# Patient Record
Sex: Female | Born: 1955
Health system: Southern US, Community
[De-identification: ages and names within clinical notes are randomized; demographics above are authoritative.]

## PROBLEM LIST (undated history)

## (undated) DIAGNOSIS — I10 Essential (primary) hypertension: Secondary | ICD-10-CM

## (undated) DIAGNOSIS — M8580 Other specified disorders of bone density and structure, unspecified site: Secondary | ICD-10-CM

## (undated) DIAGNOSIS — T7840XA Allergy, unspecified, initial encounter: Secondary | ICD-10-CM

## (undated) DIAGNOSIS — E785 Hyperlipidemia, unspecified: Secondary | ICD-10-CM

## (undated) DIAGNOSIS — N8189 Other female genital prolapse: Secondary | ICD-10-CM

## (undated) DIAGNOSIS — B379 Candidiasis, unspecified: Secondary | ICD-10-CM

## (undated) DIAGNOSIS — D219 Benign neoplasm of connective and other soft tissue, unspecified: Secondary | ICD-10-CM

## (undated) DIAGNOSIS — M1990 Unspecified osteoarthritis, unspecified site: Secondary | ICD-10-CM

## (undated) DIAGNOSIS — N926 Irregular menstruation, unspecified: Secondary | ICD-10-CM

## (undated) DIAGNOSIS — D649 Anemia, unspecified: Secondary | ICD-10-CM

## (undated) DIAGNOSIS — Z8742 Personal history of other diseases of the female genital tract: Secondary | ICD-10-CM

## (undated) DIAGNOSIS — Z8639 Personal history of other endocrine, nutritional and metabolic disease: Secondary | ICD-10-CM

## (undated) DIAGNOSIS — Z8619 Personal history of other infectious and parasitic diseases: Secondary | ICD-10-CM

## (undated) DIAGNOSIS — L900 Lichen sclerosus et atrophicus: Secondary | ICD-10-CM

## (undated) HISTORY — DX: Unspecified osteoarthritis, unspecified site: M19.90

## (undated) HISTORY — DX: Personal history of other infectious and parasitic diseases: Z86.19

## (undated) HISTORY — PX: TONSILLECTOMY: SHX5217

## (undated) HISTORY — DX: Personal history of other diseases of the female genital tract: Z87.42

## (undated) HISTORY — DX: Benign neoplasm of connective and other soft tissue, unspecified: D21.9

## (undated) HISTORY — DX: Personal history of other endocrine, nutritional and metabolic disease: Z86.39

## (undated) HISTORY — DX: Other female genital prolapse: N81.89

## (undated) HISTORY — DX: Irregular menstruation, unspecified: N92.6

## (undated) HISTORY — DX: Essential (primary) hypertension: I10

## (undated) HISTORY — PX: ABDOMINAL HYSTERECTOMY: SHX81

## (undated) HISTORY — DX: Candidiasis, unspecified: B37.9

## (undated) HISTORY — PX: COLONOSCOPY: SHX174

## (undated) HISTORY — DX: Hyperlipidemia, unspecified: E78.5

## (undated) HISTORY — DX: Allergy, unspecified, initial encounter: T78.40XA

## (undated) HISTORY — DX: Other specified disorders of bone density and structure, unspecified site: M85.80

## (undated) HISTORY — DX: Lichen sclerosus et atrophicus: L90.0

## (undated) HISTORY — DX: Anemia, unspecified: D64.9

---

## 1992-12-09 HISTORY — PX: DILATION AND EVACUATION: SHX1459

## 2003-03-31 ENCOUNTER — Other Ambulatory Visit: Admission: RE | Admit: 2003-03-31 | Discharge: 2003-03-31 | Payer: Self-pay | Admitting: Obstetrics and Gynecology

## 2003-06-03 ENCOUNTER — Encounter: Payer: Self-pay | Admitting: Emergency Medicine

## 2003-06-03 ENCOUNTER — Emergency Department (HOSPITAL_COMMUNITY): Admission: EM | Admit: 2003-06-03 | Discharge: 2003-06-03 | Payer: Self-pay | Admitting: Emergency Medicine

## 2004-05-09 ENCOUNTER — Other Ambulatory Visit: Admission: RE | Admit: 2004-05-09 | Discharge: 2004-05-09 | Payer: Self-pay | Admitting: Obstetrics and Gynecology

## 2005-06-26 ENCOUNTER — Other Ambulatory Visit: Admission: RE | Admit: 2005-06-26 | Discharge: 2005-06-26 | Payer: Self-pay | Admitting: Obstetrics and Gynecology

## 2008-02-07 HISTORY — PX: VULVA /PERINEUM BIOPSY: SHX319

## 2008-04-22 ENCOUNTER — Encounter: Payer: Self-pay | Admitting: Internal Medicine

## 2008-05-24 ENCOUNTER — Ambulatory Visit: Payer: Self-pay | Admitting: Internal Medicine

## 2009-06-06 ENCOUNTER — Emergency Department (HOSPITAL_COMMUNITY): Admission: EM | Admit: 2009-06-06 | Discharge: 2009-06-06 | Payer: Self-pay | Admitting: Family Medicine

## 2009-12-05 ENCOUNTER — Ambulatory Visit: Payer: Self-pay | Admitting: Orthopedic Surgery

## 2009-12-05 DIAGNOSIS — M654 Radial styloid tenosynovitis [de Quervain]: Secondary | ICD-10-CM | POA: Insufficient documentation

## 2010-12-20 ENCOUNTER — Encounter: Payer: Self-pay | Admitting: Internal Medicine

## 2011-01-10 NOTE — Miscellaneous (Signed)
Summary: Epipen Rx  Per Dr Regino Schultze verbal orders, she would like patient to have epipen sent to her pharmacy. Dottie Nelson-Smith CMA Duncan Dull)  December 20, 2010 12:25 PM    Clinical Lists Changes  Medications: Added new medication of EPIPEN 2-PAK 0.3 MG/0.3ML DEVI (EPINEPHRINE) Use as Directed - Signed Rx of EPIPEN 2-PAK 0.3 MG/0.3ML DEVI (EPINEPHRINE) Use as Directed;  #1 pak x 0;  Signed;  Entered by: Lamona Curl CMA (AAMA);  Authorized by: Hart Carwin MD;  Method used: Electronically to [facility]*, [address] Shipping/mailing, [place], [place]  [address], Ph: [phone], Fax: [phone]    Prescriptions: EPIPEN 2-PAK 0.3 MG/0.3ML DEVI (EPINEPHRINE) Use as Directed  #1 pak x 0   Entered by:   Lamona Curl CMA (AAMA)   Authorized by:   Hart Carwin MD   Signed by:   Lamona Curl CMA (AAMA) on 12/20/2010   Method used:   Electronically to        [facility]* (retail)       [address]       [address] Shipping/mailing       [place], [place]  [address]       Ph: [phone]       Fax: [phone]   RxID:   [id number]

## 2011-04-18 ENCOUNTER — Ambulatory Visit (HOSPITAL_COMMUNITY)
Admission: RE | Admit: 2011-04-18 | Discharge: 2011-04-18 | Disposition: A | Discharge status: Home / Self Care | Payer: 59 | Source: Ambulatory Visit | Attending: Obstetrics and Gynecology | Admitting: Obstetrics and Gynecology

## 2011-04-18 DIAGNOSIS — M79609 Pain in unspecified limb: Secondary | ICD-10-CM

## 2011-11-26 ENCOUNTER — Other Ambulatory Visit (HOSPITAL_COMMUNITY): Payer: Self-pay | Admitting: Internal Medicine

## 2011-11-26 ENCOUNTER — Ambulatory Visit (HOSPITAL_COMMUNITY)
Admission: RE | Admit: 2011-11-26 | Discharge: 2011-11-26 | Disposition: A | Discharge status: Home / Self Care | Payer: 59 | Source: Ambulatory Visit | Attending: Internal Medicine | Admitting: Internal Medicine

## 2011-11-26 DIAGNOSIS — M79609 Pain in unspecified limb: Secondary | ICD-10-CM | POA: Insufficient documentation

## 2011-11-26 DIAGNOSIS — M79606 Pain in leg, unspecified: Secondary | ICD-10-CM

## 2011-11-26 DIAGNOSIS — R937 Abnormal findings on diagnostic imaging of other parts of musculoskeletal system: Secondary | ICD-10-CM | POA: Insufficient documentation

## 2011-11-29 ENCOUNTER — Other Ambulatory Visit (HOSPITAL_COMMUNITY): Payer: Self-pay | Admitting: Orthopaedic Surgery

## 2011-11-29 DIAGNOSIS — M5410 Radiculopathy, site unspecified: Secondary | ICD-10-CM

## 2011-11-29 DIAGNOSIS — M545 Low back pain, unspecified: Secondary | ICD-10-CM

## 2011-12-05 ENCOUNTER — Ambulatory Visit (HOSPITAL_COMMUNITY)
Admission: RE | Admit: 2011-12-05 | Discharge: 2011-12-05 | Disposition: A | Discharge status: Home / Self Care | Payer: 59 | Source: Ambulatory Visit | Attending: Orthopaedic Surgery | Admitting: Orthopaedic Surgery

## 2011-12-05 DIAGNOSIS — M5410 Radiculopathy, site unspecified: Secondary | ICD-10-CM

## 2011-12-05 DIAGNOSIS — M545 Low back pain, unspecified: Secondary | ICD-10-CM | POA: Insufficient documentation

## 2011-12-05 DIAGNOSIS — M5126 Other intervertebral disc displacement, lumbar region: Secondary | ICD-10-CM | POA: Insufficient documentation

## 2011-12-05 DIAGNOSIS — M79609 Pain in unspecified limb: Secondary | ICD-10-CM | POA: Insufficient documentation

## 2011-12-05 DIAGNOSIS — IMO0002 Reserved for concepts with insufficient information to code with codable children: Secondary | ICD-10-CM | POA: Insufficient documentation

## 2011-12-30 ENCOUNTER — Ambulatory Visit (HOSPITAL_COMMUNITY)
Admission: RE | Admit: 2011-12-30 | Discharge: 2011-12-30 | Disposition: A | Discharge status: Home / Self Care | Payer: 59 | Source: Ambulatory Visit | Attending: Orthopaedic Surgery | Admitting: Orthopaedic Surgery

## 2011-12-30 DIAGNOSIS — M79609 Pain in unspecified limb: Secondary | ICD-10-CM | POA: Insufficient documentation

## 2011-12-30 DIAGNOSIS — IMO0001 Reserved for inherently not codable concepts without codable children: Secondary | ICD-10-CM | POA: Insufficient documentation

## 2011-12-30 DIAGNOSIS — R262 Difficulty in walking, not elsewhere classified: Secondary | ICD-10-CM | POA: Insufficient documentation

## 2011-12-30 NOTE — Patient Instructions (Addendum)
HEP

## 2011-12-30 NOTE — Progress Notes (Signed)
Physical Therapy Evaluation  Patient Details  Name: Yesenia Larson MRN: [id number] Date of Birth: 06/29/1956  Today's Date: 12/30/2011 Time:  -     Visit#:   of    Re-eval:   Assessment Diagnosis: Radicular back pain Prior Therapy: none  Past Medical History: No past medical history on file. Past Surgical History: No past surgical history on file.  Subjective Symptoms/Limitations Symptoms: [name] states that she began having right leg pain for about a year but has not been having low back pain.  She states that her pain is worse at night.  She states that the pain is more like a toothache.  She states she is only feeling pain in the back side of her calf.  She had an MRI which showed a small disc protrusion therefore she has been referred to PT to  try and relieve her symptoms. Limitations: Standing;Walking;Other (comment) How long can you sit comfortably?: There is no difficulty sitting. How long can you stand comfortably?: The patient states that she noticed if she is standing she notices it but she has not noticed how long until it starts bothering her but she is having pain evey day at work. How long can you walk comfortably?: Walking does not seem to bother her. Special Tests: The patient states that she occasionally wakes up due to pain approximatley two times a week. Pain Assessment Currently in Pain?: Yes Pain Score:   2 (worst in the past week has been an 8, least 0) Pain Location: Calf Pain Orientation: Right Pain Type: Chronic pain Pain Relieving Factors: elevating.  Precautions/Restrictions     Prior Functioning  Home Living Lives With: Spouse Prior Function Vocation: Full time employment Vocation Requirements: standing, twisting, pushing Leisure: Hobbies-yes (Comment) Comments: garden  Cognition/Observation Cognition Overall Cognitive Status: Appears within functional limits for tasks assessed  Sensation/Coordination/Flexibility/Functional Tests     Assessment RLE Strength Right Hip Flexion: 4/5 Right Hip Extension: 5/5 Right Hip ABduction: 5/5 Right Hip ADduction: 5/5 Right Knee Flexion: 5/5 Right Knee Extension: 5/5 Right Ankle Dorsiflexion: 4/5 Lumbar AROM Lumbar Flexion: decreased 15% with increase pain going down Lumbar Extension:  (wnl with reps causing increase pain.) Lumbar - Right Side Bend: wnl reps no change Lumbar - Left Side Bend: wnl reps increase pain Lumbar - Right Rotation: wnl Lumbar - Left Rotation: wnl  Exercise/Treatments    Stretches Active Hamstring Stretch: 1 rep;30 seconds Single Knee to Chest Stretch: 3 reps;30 seconds Standing Extension: 5 reps Lumbar Exercises   Stability Bridge: 5 reps Ab Set: 10 reps      Physical Therapy Assessment and Plan     Goals PT Short Term Goals Time to Complete Short Term Goals: 2 weeks PT Short Term Goal 1: Pain no greater than a 5  PT Short Term Goal 2: Pt to be able to verbalize and demonstrate proper lifting, squating. PT Long Term Goals Time to Complete Long Term Goals: 4 weeks PT Long Term Goal 1: I in Advance HEP PT Long Term Goal 2: Pain to be no greater than a 2 Long Term Goal 3: Radicular pain to only occur one to two times a week.  Problem List Patient Active Problem List  Diagnoses  . DEQUERVAIN'S      Larson,Yesenia 12/30/2011, 12:24 PM  Physician Documentation Your signature is required to indicate approval of the treatment plan as stated above.  Please sign and either send electronically or make a copy of this report for your files and return this  physician signed original.   Please mark one 1.__approve of plan  2. ___approve of plan with the following conditions.   ______________________________                                                          _____________________ Physician Signature                                                                                                             Date

## 2012-01-08 ENCOUNTER — Telehealth (HOSPITAL_COMMUNITY): Payer: Self-pay | Admitting: Physical Therapy

## 2012-01-08 ENCOUNTER — Ambulatory Visit (HOSPITAL_COMMUNITY)
Admission: RE | Admit: 2012-01-08 | Discharge: 2012-01-08 | Disposition: A | Discharge status: Home / Self Care | Payer: 59 | Source: Ambulatory Visit | Attending: Orthopaedic Surgery | Admitting: Orthopaedic Surgery

## 2012-01-08 ENCOUNTER — Inpatient Hospital Stay (HOSPITAL_COMMUNITY)
Admission: RE | Admit: 2012-01-08 | Discharge: 2012-01-08 | Payer: 59 | Source: Ambulatory Visit | Attending: Physical Therapy | Admitting: Physical Therapy

## 2012-01-08 NOTE — Progress Notes (Signed)
Physical Therapy Treatment Patient Details  Name: MAHROSH DONNELL MRN: [id number] Date of Birth: [birth year]  Today's Date: 01/08/2012 Time: 1528-1600 Time Calculation (min): 32 min Visit#: 2  of 4   Re-eval: 01/27/12 Charges:  30'    Subjective: Symptoms/Limitations Symptoms: Pt. states her back does not hurt at all, just her right calf.  Pt. states it has improved some since beginning therapy. Pain Assessment Currently in Pain?: Yes Pain Score:   3 Pain Location: Calf Pain Orientation: Right   Exercise/Treatments Stretches Active Hamstring Stretch: 3 reps;30 seconds Single Knee to Chest Stretch: 3 reps;30 seconds Prone on Elbows Stretch: Limitations Prone on Elbows Stretch Limitations: , instructed to do HEP Lumbar Exercises Scapular Retraction: 10 reps;Theraband Theraband Level (Scapular Retraction): Level 3 (Green) Row: 10 reps;Theraband Theraband Level (Row): Level 3 (Green) Shoulder Extension: 10 reps;Theraband Theraband Level (Shoulder Extension): Level 3 (Green) Stability Clam: 5 reps Bridge: 10 reps Ab Set: 10 reps Heel Squeeze: Prone;10 reps;5 seconds Single Arm Raise: Prone;5 reps Leg Raise: Prone;5 reps Machine Exercises Tread Mill: 5' @ 2.59mph     Physical Therapy Assessment and Plan PT Assessment and Plan Clinical Impression Statement: Added theraband for postural strengthening as well as prone exercises.  Pt. with good form and stab with prone exercises; requires reinforcement for tband form. PT Plan: Add wallsquats, gastroc stretch, prone opposite arm/leg and abdominal exercises with physioball next visit.  Give tband/written instructions for HEP.     Problem List Patient Active Problem List  Diagnoses  . DEQUERVAIN'S    PT - End of Session Activity Tolerance: Patient tolerated treatment well General Behavior During Session: [facility] for tasks performed Cognition: [facility] for tasks performed  Emeline Gins B 01/08/2012, 4:04 PM

## 2012-01-16 ENCOUNTER — Ambulatory Visit (HOSPITAL_COMMUNITY)
Admission: RE | Admit: 2012-01-16 | Discharge: 2012-01-16 | Disposition: A | Discharge status: Home / Self Care | Payer: 59 | Source: Ambulatory Visit | Attending: Internal Medicine | Admitting: Internal Medicine

## 2012-01-16 DIAGNOSIS — IMO0001 Reserved for inherently not codable concepts without codable children: Secondary | ICD-10-CM | POA: Insufficient documentation

## 2012-01-16 DIAGNOSIS — M79609 Pain in unspecified limb: Secondary | ICD-10-CM | POA: Insufficient documentation

## 2012-01-16 DIAGNOSIS — R262 Difficulty in walking, not elsewhere classified: Secondary | ICD-10-CM | POA: Insufficient documentation

## 2012-01-16 NOTE — Progress Notes (Signed)
Physical Therapy Treatment Patient Details  Name: Yesenia Larson MRN: [id number] Date of Birth: 03-Sep-1956  Today's Date: 01/16/2012 Time: 4098-1191 Time Calculation (min): 43 min Charges: 43 TE Visit#: 3  of 4   Re-eval: 01/27/12    Subjective: Symptoms/Limitations Symptoms: Yesenia Larson was a good day.  Her calf only hurt two times and she didn't hardly feel it when she was sleeping last night.  No back pain only calf pain.   Pain Assessment Pain Score:   2 Pain Location: Calf Pain Orientation: Right Pain Type: Chronic pain  Exercise/Treatments Stretches Active Hamstring Stretch: 30 seconds;2 reps Single Knee to Chest Stretch: 2 reps;30 seconds Standing Extension: 10 seconds Prone on Elbows Stretch: Limitations Prone on Elbows Stretch Limitations: , instructed to do HEP Lumbar Exercises Scapular Retraction: 10 reps;Theraband Theraband Level (Scapular Retraction): Level 3 (Green) Row: 10 reps;Theraband Theraband Level (Row): Level 3 (Green) Shoulder Extension: 10 reps;Theraband Theraband Level (Shoulder Extension): Level 3 (Green) Stability Clam: 5 reps Bridge: 15 reps Bent Knee Raise: 5 reps Ab Set: 10 reps Heel Squeeze: Prone;10 reps;5 seconds Single Arm Raise: Prone;5 reps Leg Raise: Prone;10 reps Opposite Arm/Leg Raise: Prone;5 reps Wall Slides: 10 reps Machine Exercises Tread Mill: 5' @ 2.1-2.4 Ankle Stretches Soleus Stretch: 2 reps;30 seconds Gastroc Stretch: 2 reps;30 seconds  Physical Therapy Assessment and Plan Clinical Impression Statement: The patient continues to progress well with ther ex.  She did not feel tighter on the left side during gastroc stretch.   PT Plan: Ask Arline Asp if she really wanted a re-assess next visit?  She will have only been 4 sessions including the eval?   Continue with PRE lumbar stability exercises.  Add functional squats, lifting education and isometrics supine with therapy ball.      Problem List Patient Active Problem  List  Diagnoses  . DEQUERVAIN'S   PT - End of Session Activity Tolerance: Patient tolerated treatment well General Behavior During Session: [facility] for tasks performed Cognition: [facility] for tasks performed PT Plan of Care PT Home Exercise Plan: added t-band scap exercises.  green band given for home.  see scanned report.   Consulted and Agree with Plan of Care: Patient  Rollene Rotunda. Sheketa Ende, PT, DPT  01/16/2012, 9:36 AM

## 2012-01-21 ENCOUNTER — Ambulatory Visit (HOSPITAL_COMMUNITY)
Admission: RE | Admit: 2012-01-21 | Discharge: 2012-01-21 | Disposition: A | Discharge status: Home / Self Care | Payer: 59 | Source: Ambulatory Visit | Attending: Internal Medicine | Admitting: Internal Medicine

## 2012-01-21 NOTE — Progress Notes (Signed)
Physical Therapy Treatment Patient Details  Name: Yesenia Larson MRN: [id number] Date of Birth: 06-07-1956  Today's Date: 01/21/2012 Time: 4098-1191 Time Calculation (min): 40 min Visit#: 4  of 8   Re-eval: 01/27/12 Charges: Therex x 38'  Subjective: Symptoms/Limitations Symptoms: Pt reports HEP compliance. Pain Assessment Currently in Pain?: Yes Pain Score:   5 Pain Location: Calf Pain Orientation: Right   Exercise/Treatments Stretches Active Hamstring Stretch: 30 seconds;2 reps Single Knee to Chest Stretch: 2 reps;30 seconds Lumbar Exercises Scapular Retraction: 15 reps Theraband Level (Scapular Retraction): Level 3 (Green) Row: 15 reps Theraband Level (Row): Level 3 (Green) Shoulder Extension: 15 reps Theraband Level (Shoulder Extension): Level 3 (Green) Stability Bridge: 15 reps Bent Knee Raise: 10 reps Large Ball Abdominal Isometric: 5 reps;5 seconds Large Ball Oblique Isometric: 5 reps;5 seconds Heel Squeeze: Prone;10 reps;5 seconds Single Arm Raise: 10 reps;Prone Leg Raise: 10 reps;Prone Opposite Arm/Leg Raise: 5 reps;Prone Functional Squats: 10 reps  Physical Therapy Assessment and Plan PT Assessment and Plan Clinical Impression Statement: Ab set d/c'd to HEP. Pt completes stabilization exercises with good form and control. Began abdominal isometrics with pball to improve core strength. Began funtcional squats to imrpove body mechanics. Pt reports no increase in pain at ned of session. PT Plan: Re-assess next session.     Problem List Patient Active Problem List  Diagnoses  . DEQUERVAIN'S    PT - End of Session Activity Tolerance: Patient tolerated treatment well General Behavior During Session: [facility] for tasks performed Cognition: [facility] for tasks performed  Antonieta Iba 01/21/2012, 9:01 AM

## 2012-02-06 ENCOUNTER — Ambulatory Visit (HOSPITAL_COMMUNITY)
Admission: RE | Admit: 2012-02-06 | Discharge: 2012-02-06 | Disposition: A | Discharge status: Home / Self Care | Payer: 59 | Source: Ambulatory Visit | Attending: Internal Medicine | Admitting: Internal Medicine

## 2012-02-06 NOTE — Evaluation (Signed)
Physical Therapy Evaluation  Patient Details  Name: Yesenia Larson MRN: [id number] Date of Birth: [birth year]  Today's Date: 02/06/2012 Time: 6578-4696 Time Calculation (min): 22 min  Visit#: 5  of 8   Re-eval: 02/06/12   Past Medical History: No past medical history on file. Past Surgical History: No past surgical history on file.  Subjective Symptoms/Limitations Symptoms: [name] states that she is 50% better.  She is no longer waking up from the pain and she is not longer taking anything for her pain. How long can you sit comfortably?: The patient has not had any difficulty sitting. How long can you stand comfortably?: The patient states she is able to do her standing activites without pain How long can you walk comfortably?: Walking has never been a problem Pain Assessment Currently in Pain?: Yes Pain Score:   1 (highest this week is a 10.) Pain Location: Calf Pain Orientation: Right Pain Type: Chronic pain   Assessment RLE Strength Right Hip Flexion: 5/5 (was 4/5) Right Hip Extension: 5/5 Right Hip ABduction: 5/5 Right Hip ADduction: 5/5 Right Knee Flexion: 5/5 Right Knee Extension: 5/5 Right Ankle Dorsiflexion: 5/5 ( was 4/5 ) Lumbar AROM Lumbar Flexion: wnl was decreased 15% Lumbar Extension: wnl with no pain was painful Lumbar - Right Side Bend: wnl Lumbar - Left Side Bend: wnl Lumbar - Right Rotation: wnl Lumbar - Left Rotation: wnl  Exercise/Treatments Reviewed Ball exercises; posture and body mechanics.     Physical Therapy Assessment and Plan PT Assessment and Plan Clinical Impression Statement: Pt doing much better feels that she is I with exercises and understands the importance of proper posture and body mechanics.  Pt ready for discharge. Rehab Potential: Good PT Plan: Discharge pt to HEP    Goals Home Exercise Program Pt will Perform Home Exercise Program: Independently PT Short Term Goals PT Short Term Goal 1: typically pain will occur one  to two times a day where before it was Larson all day thing.  When it hits her the pain is high PT Short Term Goal 1 - Progress: Progressing toward goal PT Short Term Goal 2: Pt is able to demonstrate and verbalize proper lifting. PT Long Term Goals PT Long Term Goal 1 - Progress: Met PT Long Term Goal 2 - Progress: Not met Long Term Goal 3 Progress: Progressing toward goal  Problem List Patient Active Problem List  Diagnoses  . DEQUERVAIN'S    PT - End of Session Activity Tolerance: Patient tolerated treatment well General Cognition: [facility] for tasks performed PT Plan of Care PT Home Exercise Plan: given previously   Hulet Ehrmann,CINDY 02/06/2012, 3:14 PM  Physician Documentation Your signature is required to indicate approval of the treatment plan as stated above.  Please sign and either send electronically or make a copy of this report for your files and return this physician signed original.   Please mark one 1.__approve of plan  2. ___approve of plan with the following conditions.   ______________________________                                                          _____________________ Physician Signature  Date  

## 2012-04-21 ENCOUNTER — Ambulatory Visit: Payer: Self-pay | Admitting: Obstetrics and Gynecology

## 2012-06-09 ENCOUNTER — Ambulatory Visit: Payer: Self-pay | Admitting: Obstetrics and Gynecology

## 2012-07-27 ENCOUNTER — Other Ambulatory Visit: Payer: Self-pay

## 2012-07-27 DIAGNOSIS — D649 Anemia, unspecified: Secondary | ICD-10-CM | POA: Insufficient documentation

## 2012-07-27 DIAGNOSIS — Z8742 Personal history of other diseases of the female genital tract: Secondary | ICD-10-CM | POA: Insufficient documentation

## 2012-07-27 DIAGNOSIS — E559 Vitamin D deficiency, unspecified: Secondary | ICD-10-CM | POA: Insufficient documentation

## 2012-07-27 DIAGNOSIS — L900 Lichen sclerosus et atrophicus: Secondary | ICD-10-CM | POA: Insufficient documentation

## 2012-07-27 DIAGNOSIS — N926 Irregular menstruation, unspecified: Secondary | ICD-10-CM | POA: Insufficient documentation

## 2012-07-27 DIAGNOSIS — Z8639 Personal history of other endocrine, nutritional and metabolic disease: Secondary | ICD-10-CM | POA: Insufficient documentation

## 2012-07-27 DIAGNOSIS — M8580 Other specified disorders of bone density and structure, unspecified site: Secondary | ICD-10-CM | POA: Insufficient documentation

## 2012-07-27 DIAGNOSIS — Z8619 Personal history of other infectious and parasitic diseases: Secondary | ICD-10-CM | POA: Insufficient documentation

## 2012-07-27 DIAGNOSIS — N8189 Other female genital prolapse: Secondary | ICD-10-CM | POA: Insufficient documentation

## 2012-07-27 DIAGNOSIS — D219 Benign neoplasm of connective and other soft tissue, unspecified: Secondary | ICD-10-CM | POA: Insufficient documentation

## 2012-07-30 ENCOUNTER — Encounter: Payer: Self-pay | Admitting: Obstetrics and Gynecology

## 2012-07-30 ENCOUNTER — Ambulatory Visit (INDEPENDENT_AMBULATORY_CARE_PROVIDER_SITE_OTHER): Payer: 59 | Admitting: Obstetrics and Gynecology

## 2012-07-30 ENCOUNTER — Other Ambulatory Visit: Payer: Self-pay | Admitting: Obstetrics and Gynecology

## 2012-07-30 VITALS — BP 128/72 | Ht 63.0 in | Wt 164.0 lb

## 2012-07-30 DIAGNOSIS — N841 Polyp of cervix uteri: Secondary | ICD-10-CM

## 2012-07-30 DIAGNOSIS — N8189 Other female genital prolapse: Secondary | ICD-10-CM

## 2012-07-30 NOTE — Progress Notes (Signed)
Subjective:  AEX:  Last Pap: 04/12/2010 WNL: Yes Regular Periods:no Contraception: Post-Menopause  Monthly Breast exam:yes Tetanus<77yrs:yes Nl.Bladder Function:yes Daily BMs:yes Healthy Diet:yes Calcium:no Mammogram:yes Date of Mammogram: 09/06/2011 Exercise:yes Have often Exercise: daily Seatbelt: yes Abuse at home: no Stressful work:yes Sigmoid-colonoscopy: 3 years ago Bone Density: Yes 04/10/2009 PCP: Dr. Ambrose Finland Change in PMH: None Change in [facility]: None    Yesenia Larson is a 56 y.o. female [phone] who presents for annual exam.  The patient has no complaints today. Had a single episode of spot of blood on tissue with wiping.  Pt thought it might be due to prolapse.  She denies pelvic pressure, urinary incontenence or difficulty emptying bladder  The following portions of the patient's history were reviewed and updated as appropriate: allergies, current medications, past family history, past medical history, past social history, past surgical history and problem list.  Review of Systems Pertinent items are noted in HPI. Gastrointestinal:No change in bowel habits, no abdominal pain, no rectal bleeding Genitourinary:negative for dysuria, frequency, hematuria, nocturia and urinary incontinence    Objective:     BP 128/72  Ht 5\' 3"  (1.6 m)  Wt 164 lb (74.39 kg)  BMI 29.05 kg/m2  Weight:  Wt Readings from Last 1 Encounters:  07/30/12 164 lb (74.39 kg)     BMI: Body mass index is 29.05 kg/(m^2). General Appearance: Alert, appropriate appearance for age. No acute distress HEENT: Grossly normal Neck / Thyroid: Supple, no masses, nodes or enlargement Lungs: clear to auscultation bilaterally Back: No CVA tenderness Breast Exam: No masses or nodes.No dimpling, nipple retraction or discharge. Cardiovascular: Regular rate and rhythm. S1, S2, no murmur Gastrointestinal: Soft, non-tender, no masses or organomegaly Pelvic Exam: External genitalia: normal general  appearance Vaginal: atrophic mucosa Cervix: lesions, 2 mm polyp.  No contact bleeding. Adnexa: mo masses palpated Uterus: normal single, nontender and uterine prolapse with cervix at introitus with Valsalva Rectovaginal: normal rectal, no masses Lymphatic Exam: Non-palpable nodes in neck, clavicular, axillary, or inguinal regions Skin: no rash or abnormalities Neurologic: Normal gait and speech, no tremor  Psychiatric: Alert and oriented, appropriate affect.    Urinalysis:Not done    Assessment:    Asymptomatic pelvic relaxation  Cervical polyp, ? Source of spotting?  Plan:   {Pap with HR HPV Cervical polyp removal with verbal consent F/U 1 yr for aex or prn

## 2012-07-31 LAB — PAP IG W/ RFLX HPV ASCU

## 2012-08-06 LAB — HUMAN PAPILLOMAVIRUS, HIGH RISK: HPV DNA High Risk: DETECTED — AB

## 2012-09-25 ENCOUNTER — Encounter: Payer: Self-pay | Admitting: Obstetrics and Gynecology

## 2012-09-25 ENCOUNTER — Encounter: Payer: 59 | Admitting: Obstetrics and Gynecology

## 2012-09-25 ENCOUNTER — Ambulatory Visit (INDEPENDENT_AMBULATORY_CARE_PROVIDER_SITE_OTHER): Payer: 59 | Admitting: Obstetrics and Gynecology

## 2012-09-25 VITALS — BP 130/86 | Temp 98.4°F | Wt 164.5 lb

## 2012-09-25 DIAGNOSIS — B369 Superficial mycosis, unspecified: Secondary | ICD-10-CM

## 2012-09-25 DIAGNOSIS — B977 Papillomavirus as the cause of diseases classified elsewhere: Secondary | ICD-10-CM

## 2012-09-25 NOTE — Patient Instructions (Addendum)

## 2012-09-25 NOTE — Progress Notes (Signed)
GYN PROBLEM VISIT  Ms. Yesenia Larson is a 56 y.o. year old female,[id number], who presents for a problem and for colposcopy  Subjective:  1)Colposcopy.   2)  Pt also c/o rash under right breast with itching x 2weeks s/p 3-5 days after receiving flu vaccine.   Treated with OTC monistat with good relief.  Has strong family hx diabetes.  Objective:  PAP RESULT Specimen adequacy: Comments: SATISFACTORY. Endocervical/transformation zone component present. FINAL DIAGNOSIS: Comments: - NEGATIVE FOR INTRAEPITHELIAL LESIONS OR MALIGNANCY. COMMENTS: Comments: Benign cellular changes are present.  HPV DNA High Risk DETECTED (A) Comments: One or more High/Intermediate HPV types (16,18,31,33,35,39,45,51,52,56,58,59,66,68) was detected.  Cervix- Biopsy, polyp:  Benign cervical/endocervical polyp.  Physical examination: BP 130/86  Temp 98.4 F (36.9 C)  Wt 164 lb 8 oz (74.617 kg)  Thorax:  right submammary postinflammatory hyperpigmentation  Pelvic: External genitalia: normal general appearance Vaginal: atrophic mucosa Cervix: prolapse to within 2cm of introitus  Colposcopy Procedure Note  Indications: Pap smear 2 months ago showed: benign cellular changes. Positive high risk HPV   The prior pap showed no abnormalities.     Prior cervical/vaginal disease: no history of prior disease.    Prior cervical treatment: removal of cervical polyp.  Procedure Details  The risks and benefits of the procedure and verbal and written informed consent obtained.  Speculum placed in vagina and excellent visualization of cervix achieved, cervix swabbed x 3 with acetic acid solution.  Findings: Cervix: no visible lesions, no mosaicism, no punctation and no abnormal vasculature; endocervical speculum placed, SCJ visualized 360 degrees without lesions, no biopsies taken and endocervical curettage performed. Vaginal inspection: vaginal colposcopy not performed. Vulvar colposcopy: vulvar colposcopy not  performed.  Specimens: endocervical curettage  Complications: none.   Assessment: Normal Pap smear with positive high risk HPV Fungal dermatitis Family history of diabetes   Plan: Specimens labelled and sent to Pathology. Will base further treatment on Pathology findings. Treatment options discussed with patient. Post biopsy instructions given to patient. Hemoglobin A1c Return to office for annual exam or as needed   Dierdre Forth, MD  09/25/2012 3:26 PM

## 2012-09-26 LAB — HEMOGLOBIN A1C
Hgb A1c MFr Bld: 6 % — ABNORMAL HIGH (ref <5.7)
Mean Plasma Glucose: 126 mg/dL — ABNORMAL HIGH (ref <117)

## 2012-09-29 LAB — PATHOLOGY

## 2014-10-10 ENCOUNTER — Encounter: Payer: Self-pay | Admitting: Obstetrics and Gynecology

## 2014-12-28 ENCOUNTER — Encounter: Payer: Self-pay | Admitting: Family

## 2014-12-28 ENCOUNTER — Ambulatory Visit (INDEPENDENT_AMBULATORY_CARE_PROVIDER_SITE_OTHER): Payer: 59 | Admitting: Family

## 2014-12-28 DIAGNOSIS — R42 Dizziness and giddiness: Secondary | ICD-10-CM

## 2014-12-28 MED ORDER — METOPROLOL TARTRATE 50 MG PO TABS: 50.0000 mg | ORAL_TABLET | Freq: Two times a day (BID) | ORAL | Status: DC

## 2014-12-28 NOTE — Assessment & Plan Note (Signed)
Lightheadedness most likely related to increase in blood pressure as neurological exam is normal.. EKG obtained in office shows normal sinus rhythm with nonspecific changes in aVF. Start metoprolol. Follow-up in one week to determine blood pressure control. Patient advised to seek further medical care if lightheadedness increases or cardiac symptoms develop.   Note-patient is currently taking royal jelly for her allergies. Research shows that this could potentially lower her blood pressure. Discussed the risks and benefits of decreasing the supplement.

## 2014-12-28 NOTE — Progress Notes (Signed)
Pre visit review using our clinic review tool, if applicable. No additional management support is needed unless otherwise documented below in the visit note. 

## 2014-12-28 NOTE — Progress Notes (Signed)
   Subjective:    Patient ID: Yesenia Larson, female    DOB: January 10, 1956, 59 y.o.   MRN: [id number]  Chief Complaint  Patient presents with  . Light headed    Says she has been feeling light headed x4 days, BP has been elevated, says she is feeling light headed right now sitting here    HPI:  Yesenia Larson is a 59 y.o. female who presents today for an acute visit.   Associated symptoms of feeling lightheaded has been going on for approximately 4 days now. Indicates that it occurs when she bends down and stands back up.   Allergies  Allergen Reactions  . Shrimp [Shellfish Allergy] Swelling    Current Outpatient Prescriptions on File Prior to Visit  Medication Sig Dispense Refill  . aspirin 81 MG tablet Take 81 mg by mouth daily.    . calcium carbonate 200 MG capsule Take 250 mg by mouth 2 (two) times daily with a meal.    . fish oil-omega-3 fatty acids 1000 MG capsule Take 2 g by mouth daily.    . Multiple Vitamin (MULTIVITAMIN) tablet Take 1 tablet by mouth daily.     No current facility-administered medications on file prior to visit.    Review of Systems  Constitutional: Negative for fever and chills.  HENT: Negative for congestion.   Eyes:       Negative for change in vision  Respiratory: Negative for cough, chest tightness and shortness of breath.   Cardiovascular: Negative for chest pain, palpitations and leg swelling.  Endocrine: Negative for cold intolerance, heat intolerance, polydipsia, polyphagia and polyuria.  Neurological: Positive for light-headedness. Negative for dizziness, facial asymmetry, speech difficulty, weakness and headaches.      Objective:    BP 192/122 mmHg  Pulse 80  Temp(Src) 98.1 F (36.7 C)  Resp 20  Ht 5\' 3"  (1.6 m)  Wt 163 lb 6.4 oz (74.118 kg)  BMI 28.95 kg/m2  SpO2 98% Nursing note and vital signs reviewed. Retake blood pressure was 172/98 in bilateral arms.   Physical Exam  Constitutional: She is oriented to person, place,  and time. She appears well-developed and well-nourished. No distress.  HENT:  Right Ear: Hearing, tympanic membrane, external ear and ear canal normal.  Left Ear: Hearing, tympanic membrane, external ear and ear canal normal.  Nose: Nose normal.  Mouth/Throat: Uvula is midline, oropharynx is clear and moist and mucous membranes are normal.  Eyes: Conjunctivae and EOM are normal. Pupils are equal, round, and reactive to light.  Cardiovascular: Normal rate, regular rhythm, normal heart sounds and intact distal pulses.   Pulmonary/Chest: Effort normal and breath sounds normal.  Neurological: She is alert and oriented to person, place, and time. She has normal reflexes. No cranial nerve deficit. Coordination normal.  Skin: Skin is warm and dry.  Psychiatric: She has a normal mood and affect. Her behavior is normal. Judgment and thought content normal.       Assessment & Plan:

## 2014-12-28 NOTE — Patient Instructions (Signed)
Thank you for choosing [facility].  Summary/Instructions:  Your prescription(s) have been submitted to your pharmacy or been printed and provided for you. Please take as directed and contact our office if you believe you are having problem(s) with the medication(s) or have any questions.  If your symptoms worsen or fail to improve, please contact our office for further instruction, or in case of emergency go directly to the emergency room at the closest medical facility.    Hypertension Hypertension, commonly called high blood pressure, is when the force of blood pumping through your arteries is too strong. Your arteries are the blood vessels that carry blood from your heart throughout your body. A blood pressure reading consists of a higher number over a lower number, such as 110/72. The higher number (systolic) is the pressure inside your arteries when your heart pumps. The lower number (diastolic) is the pressure inside your arteries when your heart relaxes. Ideally you want your blood pressure below 120/80. Hypertension forces your heart to work harder to pump blood. Your arteries may become narrow or stiff. Having hypertension puts you at risk for heart disease, stroke, and other problems.  RISK FACTORS Some risk factors for high blood pressure are controllable. Others are not.  Risk factors you cannot control include:   Race. You may be at higher risk if you are African American.  Age. Risk increases with age.  Gender. Men are at higher risk than women before age 14 years. After age 15, women are at higher risk than men. Risk factors you can control include:  Not getting enough exercise or physical activity.  Being overweight.  Getting too much fat, sugar, calories, or salt in your diet.  Drinking too much alcohol. SIGNS AND SYMPTOMS Hypertension does not usually cause signs or symptoms. Extremely high blood pressure (hypertensive crisis) may cause headache, anxiety,  shortness of breath, and nosebleed. DIAGNOSIS  To check if you have hypertension, your health care provider will measure your blood pressure while you are seated, with your arm held at the level of your heart. It should be measured at least twice using the same arm. Certain conditions can cause a difference in blood pressure between your right and left arms. A blood pressure reading that is higher than normal on one occasion does not mean that you need treatment. If one blood pressure reading is high, ask your health care provider about having it checked again. TREATMENT  Treating high blood pressure includes making lifestyle changes and possibly taking medicine. Living a healthy lifestyle can help lower high blood pressure. You may need to change some of your habits. Lifestyle changes may include:  Following the DASH diet. This diet is high in fruits, vegetables, and whole grains. It is low in salt, red meat, and added sugars.  Getting at least 2 hours of brisk physical activity every week.  Losing weight if necessary.  Not smoking.  Limiting alcoholic beverages.  Learning ways to reduce stress. If lifestyle changes are not enough to get your blood pressure under control, your health care provider may prescribe medicine. You may need to take more than one. Work closely with your health care provider to understand the risks and benefits. HOME CARE INSTRUCTIONS  Have your blood pressure rechecked as directed by your health care provider.   Take medicines only as directed by your health care provider. Follow the directions carefully. Blood pressure medicines must be taken as prescribed. The medicine does not work as well when you skip  doses. Skipping doses also puts you at risk for problems.   Do not smoke.   Monitor your blood pressure at home as directed by your health care provider. SEEK MEDICAL CARE IF:   You think you are having a reaction to medicines taken.  You have  recurrent headaches or feel dizzy.  You have swelling in your ankles.  You have trouble with your vision. SEEK IMMEDIATE MEDICAL CARE IF:  You develop a severe headache or confusion.  You have unusual weakness, numbness, or feel faint.  You have severe chest or abdominal pain.  You vomit repeatedly.  You have trouble breathing. MAKE SURE YOU:   Understand these instructions.  Will watch your condition.  Will get help right away if you are not doing well or get worse. Document Released: 11/25/2005 Document Revised: 04/11/2014 Document Reviewed: 09/17/2013 [facility] Patient Information 2015 [place], [place]. This information is not intended to replace advice given to you by your health care provider. Make sure you discuss any questions you have with your health care provider.

## 2015-01-06 ENCOUNTER — Ambulatory Visit: Payer: 59 | Admitting: Family

## 2015-01-13 ENCOUNTER — Ambulatory Visit (INDEPENDENT_AMBULATORY_CARE_PROVIDER_SITE_OTHER): Payer: 59 | Admitting: Family

## 2015-01-13 ENCOUNTER — Other Ambulatory Visit (INDEPENDENT_AMBULATORY_CARE_PROVIDER_SITE_OTHER): Payer: 59

## 2015-01-13 ENCOUNTER — Telehealth: Payer: Self-pay | Admitting: Family

## 2015-01-13 ENCOUNTER — Encounter: Payer: Self-pay | Admitting: Family

## 2015-01-13 VITALS — BP 150/92 | HR 63 | Temp 98.0°F | Resp 18 | Wt 158.4 lb

## 2015-01-13 DIAGNOSIS — I10 Essential (primary) hypertension: Secondary | ICD-10-CM

## 2015-01-13 LAB — BASIC METABOLIC PANEL
BUN: 9 mg/dL (ref 6–23)
CALCIUM: 9.9 mg/dL (ref 8.4–10.5)
CHLORIDE: 106 meq/L (ref 96–112)
CO2: 29 meq/L (ref 19–32)
Creatinine, Ser: 0.7 mg/dL (ref 0.40–1.20)
GFR: 109.48 mL/min (ref >60.00)
GLUCOSE: 96 mg/dL (ref 70–99)
Potassium: 4.3 mEq/L (ref 3.5–5.1)
Sodium: 141 mEq/L (ref 135–145)

## 2015-01-13 LAB — LIPID PANEL
CHOLESTEROL: 260 mg/dL — AB (ref 0–200)
HDL: 43.2 mg/dL (ref >39.00)
LDL CALC: 196 mg/dL — AB (ref 0–99)
NonHDL: 216.8
TRIGLYCERIDES: 104 mg/dL (ref 0.0–149.0)
Total CHOL/HDL Ratio: 6
VLDL: 20.8 mg/dL (ref 0.0–40.0)

## 2015-01-13 LAB — TSH: TSH: 2.46 u[IU]/mL (ref 0.35–4.50)

## 2015-01-13 MED ORDER — PRAVASTATIN SODIUM 40 MG PO TABS: 40.0000 mg | ORAL_TABLET | Freq: Every day | ORAL | Status: DC

## 2015-01-13 NOTE — Patient Instructions (Addendum)
Thank you for choosing [facility].  Summary/Instructions:  Please keep taking the Metoprolol as prescribed.  Please stop by the lab on the basement level of the building for your blood work. Your results will be released to [place] (or called to you) after review, usually within 72 hours after test completion. If any changes need to be made, you will be notified at that same time.  If your symptoms worsen or fail to improve, please contact our office for further instruction, or in case of emergency go directly to the emergency room at the closest medical facility.

## 2015-01-13 NOTE — Progress Notes (Signed)
Pre visit review using our clinic review tool, if applicable. No additional management support is needed unless otherwise documented below in the visit note. 

## 2015-01-13 NOTE — Telephone Encounter (Signed)
Pt aware of results. Pt is upstairs at an appointment and offered to come down to pick the results up.

## 2015-01-13 NOTE — Assessment & Plan Note (Signed)
Blood pressure remains slightly elevated however better control. Home readings are in the 130s/80s. Continue metoprolol at current dosage. Headaches are most likely related to her decreasing caffeine intake. Obtain basic metabolic panel, lipid profile and TSH. Follow-up in about 3 months

## 2015-01-13 NOTE — Progress Notes (Signed)
   Subjective:    Patient ID: Yesenia Larson, female    DOB: 09/20/1956, 59 y.o.   MRN: [id number]  Chief Complaint  Patient presents with  . Follow-up    says that she has now been having headaches, says her BP goes up and down,     HPI:  Yesenia Larson is a 59 y.o. female who presents today to follow up for her blood pressure.   Patient was recently seen for for lightheadedness and elevation in blood pressure. She was started on metoprolol.  Indicates that she has had a couple of headaches since Wednesday. Headaches were described as generalized with an achy feeling. States that she has also cut back on her caffeine intake. Blood pressures at home range in the 130s/80s.   BP Readings from Last 3 Encounters:  01/13/15 150/92  12/28/14 192/122  09/25/12 130/86    Allergies  Allergen Reactions  . Shrimp [Shellfish Allergy] Swelling    Current Outpatient Prescriptions on File Prior to Visit  Medication Sig Dispense Refill  . aspirin 81 MG tablet Take 81 mg by mouth daily.    . calcium carbonate 200 MG capsule Take 250 mg by mouth 2 (two) times daily with a meal.    . fish oil-omega-3 fatty acids 1000 MG capsule Take 2 g by mouth daily.    . metoprolol (LOPRESSOR) 50 MG tablet Take 1 tablet (50 mg total) by mouth 2 (two) times daily. 60 tablet 0  . Multiple Vitamin (MULTIVITAMIN) tablet Take 1 tablet by mouth daily.     No current facility-administered medications on file prior to visit.    Review of Systems  Eyes:       Negative for changes in vision  Respiratory: Negative for chest tightness.   Cardiovascular: Negative for chest pain, palpitations and leg swelling.      Objective:    BP 150/92 mmHg  Pulse 63  Temp(Src) 98 F (36.7 C) (Oral)  Resp 18  Wt 158 lb 6.4 oz (71.85 kg)  SpO2 98% Nursing note and vital signs reviewed.  Physical Exam  Constitutional: She is oriented to person, place, and time. She appears well-developed and well-nourished. No distress.    Cardiovascular: Normal rate, regular rhythm, normal heart sounds and intact distal pulses.   Pulmonary/Chest: Effort normal and breath sounds normal.  Neurological: She is alert and oriented to person, place, and time.  Skin: Skin is warm and dry.  Psychiatric: She has a normal mood and affect. Her behavior is normal. Judgment and thought content normal.       Assessment & Plan:

## 2015-01-13 NOTE — Telephone Encounter (Signed)
Please inform the patient that her thyroid, kidney function, electrolytes were all within the normal ranges. However her cholesterol was elevated. Her total cholesterol was 260 with a goal of less than 200; her LDL was 196 with a goal less than 100 and her HDL was 43 which is appropriate. Given these numbers it is recommended she begin a statin for her cholesterol. I have sent an order for pravastatin to her pharmacy for which I would like her to begin taking. I also Recommend increasing the nutrient density of her diet through increased fruit, vegetable and fiber intake while decreasing saturated fats and processed foods. Also working towards 30 minutes of moderate physical activity most days of the week.  If she develops muscle pain on the medication please stop taking the medication and let us know. Otherwise, we will follow-up in about 6 months.  Lab Results  Component Value Date   CHOL 260* 01/13/2015   HDL 43.20 01/13/2015   LDLCALC 196* 01/13/2015   TRIG 104.0 01/13/2015   CHOLHDL 6 01/13/2015

## 2015-01-25 ENCOUNTER — Telehealth: Payer: Self-pay | Admitting: Family

## 2015-01-25 DIAGNOSIS — R42 Dizziness and giddiness: Secondary | ICD-10-CM

## 2015-01-25 MED ORDER — METOPROLOL TARTRATE 50 MG PO TABS: 50.0000 mg | ORAL_TABLET | Freq: Two times a day (BID) | ORAL | Status: DC

## 2015-01-25 NOTE — Telephone Encounter (Signed)
Medication sent.

## 2015-01-25 NOTE — Telephone Encounter (Signed)
Patient is requesting metoprolol to be sent to [facility] long outpatient.

## 2015-02-21 ENCOUNTER — Telehealth: Payer: Self-pay | Admitting: Family

## 2015-02-21 DIAGNOSIS — R42 Dizziness and giddiness: Secondary | ICD-10-CM

## 2015-02-21 MED ORDER — METOPROLOL TARTRATE 50 MG PO TABS
50.0000 mg | ORAL_TABLET | Freq: Two times a day (BID) | ORAL | Status: DC
Start: 2015-02-21 — End: 2015-03-16

## 2015-02-21 NOTE — Telephone Encounter (Signed)
Medication refilled

## 2015-02-21 NOTE — Telephone Encounter (Signed)
Pt called in and said that the metoprolol (LOPRESSOR) 50 MG tablet [[id number] is working and wanted to know if Marya Amsler can call in a refill

## 2015-03-16 ENCOUNTER — Ambulatory Visit (INDEPENDENT_AMBULATORY_CARE_PROVIDER_SITE_OTHER): Payer: 59 | Admitting: Family

## 2015-03-16 ENCOUNTER — Encounter: Payer: Self-pay | Admitting: Family

## 2015-03-16 VITALS — BP 118/88 | HR 54 | Temp 98.2°F | Resp 18 | Ht 63.0 in | Wt 159.0 lb

## 2015-03-16 DIAGNOSIS — I10 Essential (primary) hypertension: Secondary | ICD-10-CM

## 2015-03-16 DIAGNOSIS — J069 Acute upper respiratory infection, unspecified: Secondary | ICD-10-CM | POA: Diagnosis not present

## 2015-03-16 MED ORDER — METOPROLOL TARTRATE 50 MG PO TABS: 50.0000 mg | ORAL_TABLET | Freq: Two times a day (BID) | ORAL | Status: DC

## 2015-03-16 MED ORDER — AZITHROMYCIN 250 MG PO TABS: ORAL_TABLET | ORAL | Status: DC

## 2015-03-16 NOTE — Assessment & Plan Note (Signed)
Refilled metoprolol 

## 2015-03-16 NOTE — Assessment & Plan Note (Signed)
Symptoms and exam consistent with upper respiratory infection. Continue over-the-counter medications as needed for symptom relief and supportive care. Patient provided prescription for azithromycin should symptoms worsen in the next 3-5 days. Follow-up if symptoms worsen or fail to improve.

## 2015-03-16 NOTE — Patient Instructions (Signed)
Thank you for choosing [facility].  Summary/Instructions:  Your prescription(s) have been submitted to your pharmacy or been printed and provided for you. Please take as directed and contact our office if you believe you are having problem(s) with the medication(s) or have any questions.  If your symptoms worsen or fail to improve, please contact our office for further instruction, or in case of emergency go directly to the emergency room at the closest medical facility.   General Recommendations:    Please drink plenty of fluids.  Get plenty of rest   Sleep in humidified air  Use saline nasal sprays  Netti pot   OTC Medications:  Decongestants - helps relieve congestion   Flonase (generic fluticasone) or Nasacort (generic triamcinolone) - please make sure to use the "cross-over" technique at a 45 degree angle towards the opposite eye as opposed to straight up the nasal passageway.   Sudafed (generic pseudoephedrine - Note this is the one that is available behind the pharmacy counter); Products with phenylephrine (-PE) may also be used but is often not as effective as pseudoephedrine.   If you have HIGH BLOOD PRESSURE - Coricidin HBP; AVOID any product that is -D as this contains pseudoephedrine which may increase your blood pressure.  Afrin (oxymetazoline) every 6-8 hours for up to 3 days.   Allergies - helps relieve runny nose, itchy eyes and sneezing   Claritin (generic loratidine), Allegra (fexofenidine), or Zyrtec (generic cyrterizine) for runny nose. These medications should not cause drowsiness.  Note - Benadryl (generic diphenhydramine) may be used however may cause drowsiness  Cough -   Delsym or Robitussin (generic dextromethorphan)  Expectorants - helps loosen mucus to ease removal   Mucinex (generic guaifenesin) as directed on the package.  Headaches / General Aches   Tylenol (generic acetaminophen) - DO NOT EXCEED 3 grams (3,000 mg) in a 24  hour time period  Advil/Motrin (generic ibuprofen)   Sore Throat -   Salt water gargle   Chloraseptic (generic benzocaine) spray or lozenges / Sucrets (generic dyclonine)        Upper Respiratory Infection, Adult An upper respiratory infection (URI) is also sometimes known as the common cold. The upper respiratory tract includes the nose, sinuses, throat, trachea, and bronchi. Bronchi are the airways leading to the lungs. Most people improve within 1 week, but symptoms can last up to 2 weeks. A residual cough may last even longer.  CAUSES Many different viruses can infect the tissues lining the upper respiratory tract. The tissues become irritated and inflamed and often become very moist. Mucus production is also common. A cold is contagious. You can easily spread the virus to others by oral contact. This includes kissing, sharing a glass, coughing, or sneezing. Touching your mouth or nose and then touching a surface, which is then touched by another person, can also spread the virus. SYMPTOMS  Symptoms typically develop 1 to 3 days after you come in contact with a cold virus. Symptoms vary from person to person. They may include: 4. Runny nose. 5. Sneezing. 6. Nasal congestion. 7. Sinus irritation. 8. Sore throat. 9. Loss of voice (laryngitis). 10. Cough. 11. Fatigue. 12. Muscle aches. 13. Loss of appetite. 14. Headache. 15. Low-grade fever. DIAGNOSIS  You might diagnose your own cold based on familiar symptoms, since most people get a cold 2 to 3 times a year. Your caregiver can confirm this based on your exam. Most importantly, your caregiver can check that your symptoms are not due to   another disease such as strep throat, sinusitis, pneumonia, asthma, or epiglottitis. Blood tests, throat tests, and X-rays are not necessary to diagnose a common cold, but they may sometimes be helpful in excluding other more serious diseases. Your caregiver will decide if any further tests are  required. RISKS AND COMPLICATIONS  You may be at risk for a more severe case of the common cold if you smoke cigarettes, have chronic heart disease (such as heart failure) or lung disease (such as asthma), or if you have a weakened immune system. The very young and very old are also at risk for more serious infections. Bacterial sinusitis, middle ear infections, and bacterial pneumonia can complicate the common cold. The common cold can worsen asthma and chronic obstructive pulmonary disease (COPD). Sometimes, these complications can require emergency medical care and may be life-threatening. PREVENTION  The best way to protect against getting a cold is to practice good hygiene. Avoid oral or hand contact with people with cold symptoms. Wash your hands often if contact occurs. There is no clear evidence that vitamin C, vitamin E, echinacea, or exercise reduces the chance of developing a cold. However, it is always recommended to get plenty of rest and practice good nutrition. TREATMENT  Treatment is directed at relieving symptoms. There is no cure. Antibiotics are not effective, because the infection is caused by a virus, not by bacteria. Treatment may include: 2. Increased fluid intake. Sports drinks offer valuable electrolytes, sugars, and fluids. 3. Breathing heated mist or steam (vaporizer or shower). 4. Eating chicken soup or other clear broths, and maintaining good nutrition. 5. Getting plenty of rest. 6. Using gargles or lozenges for comfort. 7. Controlling fevers with ibuprofen or acetaminophen as directed by your caregiver. 8. Increasing usage of your inhaler if you have asthma. Zinc gel and zinc lozenges, taken in the first 24 hours of the common cold, can shorten the duration and lessen the severity of symptoms. Pain medicines may help with fever, muscle aches, and throat pain. A variety of non-prescription medicines are available to treat congestion and runny nose. Your caregiver can make  recommendations and may suggest nasal or lung inhalers for other symptoms.  HOME CARE INSTRUCTIONS  2. Only take over-the-counter or prescription medicines for pain, discomfort, or fever as directed by your caregiver. 3. Use a warm mist humidifier or inhale steam from a shower to increase air moisture. This may keep secretions moist and make it easier to breathe. 4. Drink enough water and fluids to keep your urine clear or pale yellow. 5. Rest as needed. 6. Return to work when your temperature has returned to normal or as your caregiver advises. You may need to stay home longer to avoid infecting others. You can also use a face mask and careful hand washing to prevent spread of the virus. SEEK MEDICAL CARE IF:  3. After the first few days, you feel you are getting worse rather than better. 4. You need your caregiver's advice about medicines to control symptoms. 5. You develop chills, worsening shortness of breath, or brown or red sputum. These may be signs of pneumonia. 6. You develop yellow or brown nasal discharge or pain in the face, especially when you bend forward. These may be signs of sinusitis. 7. You develop a fever, swollen neck glands, pain with swallowing, or white areas in the back of your throat. These may be signs of strep throat. SEEK IMMEDIATE MEDICAL CARE IF:  3. You have a fever. 4. You develop severe or   persistent headache, ear pain, sinus pain, or chest pain. 5. You develop wheezing, a prolonged cough, cough up blood, or have a change in your usual mucus (if you have chronic lung disease). 6. You develop sore muscles or a stiff neck. Document Released: 05/21/2001 Document Revised: 02/17/2012 Document Reviewed: 03/02/2014 [employer] Patient Information 2015 [employer]. This information is not intended to replace advice given to you by your health care provider. Make sure you discuss any questions you have with your health care provider.    

## 2015-03-16 NOTE — Progress Notes (Signed)
   Subjective:    Patient ID: Yesenia Larson, female    DOB: 07-09-1956, 59 y.o.   MRN: [id number]  Chief Complaint  Patient presents with  . Cough    x5 days, productive cough, symptoms are worse at night, said she did run a fever the 2nd day into being sick but that has subsided, has had some head congestion, lightheadedness that started yesterday,    HPI:  Yesenia Larson is a 59 y.o. female who presents today for an acute visit.  This is a new problem. Associated symptoms of productive cough, head congestion, and lightheadedness has been going on for approximately 5 days. Timing of symptoms is worse at night. Modifying factors include Mucinex-DM which has helped a little. Notes that the cough is worsening and has progressed to the day time.   Allergies  Allergen Reactions  . Shrimp [Shellfish Allergy] Swelling    Current Outpatient Prescriptions on File Prior to Visit  Medication Sig Dispense Refill  . aspirin 81 MG tablet Take 81 mg by mouth daily.    . calcium carbonate 200 MG capsule Take 250 mg by mouth 2 (two) times daily with a meal.    . fish oil-omega-3 fatty acids 1000 MG capsule Take 2 g by mouth daily.    . metoprolol (LOPRESSOR) 50 MG tablet Take 1 tablet (50 mg total) by mouth 2 (two) times daily. 60 tablet 0  . Multiple Vitamin (MULTIVITAMIN) tablet Take 1 tablet by mouth daily.     No current facility-administered medications on file prior to visit.    Review of Systems  Constitutional: Positive for fever. Negative for chills.  HENT: Positive for congestion. Negative for ear pain, facial swelling, sinus pressure and sore throat.   Respiratory: Positive for cough. Negative for chest tightness and shortness of breath.   Neurological: Positive for light-headedness and headaches.      Objective:    BP 118/88 mmHg  Pulse 54  Temp(Src) 98.2 F (36.8 C) (Oral)  Resp 18  Ht 5\' 3"  (1.6 m)  Wt 159 lb (72.122 kg)  BMI 28.17 kg/m2  SpO2 98% Nursing note and  vital signs reviewed.  Physical Exam  Constitutional: She is oriented to person, place, and time. She appears well-developed and well-nourished. No distress.  HENT:  Right Ear: Hearing, tympanic membrane, external ear and ear canal normal.  Left Ear: Hearing, tympanic membrane, external ear and ear canal normal.  Nose: Right sinus exhibits no maxillary sinus tenderness and no frontal sinus tenderness. Left sinus exhibits no maxillary sinus tenderness and no frontal sinus tenderness.  Mouth/Throat: Uvula is midline, oropharynx is clear and moist and mucous membranes are normal.  Neck: Neck supple.  Cardiovascular: Normal rate, regular rhythm, normal heart sounds and intact distal pulses.   Pulmonary/Chest: Effort normal and breath sounds normal.  Lymphadenopathy:    She has no cervical adenopathy.  Neurological: She is alert and oriented to person, place, and time.  Skin: Skin is warm and dry.  Psychiatric: She has a normal mood and affect. Her behavior is normal. Judgment and thought content normal.       Assessment & Plan:

## 2015-03-16 NOTE — Progress Notes (Signed)
Pre visit review using our clinic review tool, if applicable. No additional management support is needed unless otherwise documented below in the visit note. 

## 2015-07-27 ENCOUNTER — Telehealth: Payer: Self-pay | Admitting: Family

## 2015-07-27 NOTE — Telephone Encounter (Signed)
Rec'd from [facility] forward 6 pages to Dr. Elna Breslow

## 2015-08-17 ENCOUNTER — Encounter: Payer: Self-pay | Admitting: Internal Medicine

## 2016-01-05 DIAGNOSIS — E559 Vitamin D deficiency, unspecified: Secondary | ICD-10-CM | POA: Diagnosis not present

## 2016-01-25 MED FILL — METOPROLOL TARTRATE 50 MG T: 50 | 90 days supply | Qty: 180 | Fill #3

## 2016-01-31 ENCOUNTER — Encounter: Payer: Self-pay | Admitting: Internal Medicine

## 2016-01-31 ENCOUNTER — Encounter: Payer: Self-pay | Admitting: Emergency Medicine

## 2016-01-31 ENCOUNTER — Ambulatory Visit (INDEPENDENT_AMBULATORY_CARE_PROVIDER_SITE_OTHER): Payer: 59 | Admitting: Internal Medicine

## 2016-01-31 VITALS — BP 134/80 | HR 61 | Temp 98.9°F | Resp 16 | Wt 162.0 lb

## 2016-01-31 DIAGNOSIS — J111 Influenza due to unidentified influenza virus with other respiratory manifestations: Secondary | ICD-10-CM | POA: Diagnosis not present

## 2016-01-31 MED ORDER — OSELTAMIVIR PHOSPHATE 75 MG PO CAPS: 75.0000 mg | ORAL_CAPSULE | Freq: Two times a day (BID) | ORAL | Status: DC

## 2016-01-31 MED FILL — OSELTAMIVIR PHOS 75 MG CAP: 75 | 5 days supply | Qty: 10 | Fill #0

## 2016-01-31 NOTE — Progress Notes (Signed)
Pre visit review using our clinic review tool, if applicable. No additional management support is needed unless otherwise documented below in the visit note. 

## 2016-01-31 NOTE — Progress Notes (Signed)
Subjective:    Patient ID: Yesenia Larson, female    DOB: 12-03-1956, 60 y.o.   MRN: [id number]  HPI  She is here for cold symptoms.    Her symptoms started over the weekend.  She started to have nausea,but never threw up.  She had a fever of 102 for a couple of day.  She was in bed all weekend.  Last night her fever was 101.9.  She has no appetite, fatigued, mild left ear pain, rhinorrhea, headaches, lightheadedness, cough with minimal phlegm and some mild wheeze at night.  She denies sob and muscle pain.   She took mucinex and tylenol.   Medications and allergies reviewed with patient and updated if appropriate.  Patient Active Problem List   Diagnosis Date Noted  . Acute upper respiratory infection 03/16/2015  . Essential hypertension 01/13/2015  . Lightheadedness 12/28/2014  . Pelvic relaxation   . Lichen sclerosus   . H/O vitamin D deficiency   . Osteopenia   . Fibroids   . Anemia   . Irregular menses   . Hx of amenorrhea   . H/O gonorrhea   . Mild anemia   . 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 aspirin 81 MG tablet Take 81 mg by mouth daily.    . calcium carbonate 200 MG capsule Take 250 mg by mouth 2 (two) times daily with a meal.    . fish oil-omega-3 fatty acids 1000 MG capsule Take 2 g by mouth daily.    . metoprolol (LOPRESSOR) 50 MG tablet Take 1 tablet (50 mg total) by mouth 2 (two) times daily. 180 tablet 3  . Multiple Vitamin (MULTIVITAMIN) tablet Take 1 tablet by mouth daily.     No current facility-administered medications on file prior to visit.    Past Medical History  Diagnosis Date  . Pelvic relaxation   . Lichen sclerosus   . H/O vitamin D deficiency   . Osteopenia   . Fibroids   . Anemia     H/O  . Irregular menses     H/O  . Hx of amenorrhea   . H/O gonorrhea   . Candidiasis     H/O  . Mild anemia   . Hypertension     Past Surgical History    Procedure Laterality Date  . Vulva /perineum biopsy  02/2008  . Dilation and evacuation  1994  . Tonsillectomy      Social History   Social History  . Marital Status: Married    Spouse Name: N/A  . Number of Children: N/A  . Years of Education: N/A   Social History Main Topics  . Smoking status: Never Smoker   . Smokeless tobacco: Never Used  . Alcohol Use: No  . Drug Use: No  . Sexual Activity: Yes    Birth Control/ Protection: Post-menopausal   Other Topics Concern  . Not on file   Social History Narrative    Family History  Problem Relation Age of Onset  . Colon polyps Sister   . Diabetes Sister   . Diabetes Brother   . Diabetes Sister     Review of Systems  Constitutional: Positive for fever, chills, appetite change and fatigue.  HENT: Positive for ear pain (left, mild) and rhinorrhea. Negative for congestion, sinus pressure and sore throat.   Respiratory: Positive for cough (associated with chest pain, clear-yellow phlegm) and wheezing (at night).  Negative for shortness of breath.   Gastrointestinal: Positive for nausea.  Musculoskeletal: Negative for myalgias.  Neurological: Positive for dizziness, light-headedness and headaches.       Objective:   Filed Vitals:   01/31/16 0930  BP: 134/80  Pulse: 61  Temp: 98.9 F (37.2 C)  Resp: 16   Filed Weights   01/31/16 0930  Weight: 162 lb (73.483 kg)   Body mass index is 28.7 kg/(m^2).   Physical Exam GENERAL APPEARANCE: Appears stated age, mildly ill appearing, NAD EYES: conjunctiva clear, no icterus HEENT: bilateral tympanic membranes and ear canals normal, oropharynx with mild erythema, no thyromegaly, trachea midline, no cervical or supraclavicular lymphadenopathy LUNGS: Clear to auscultation without wheeze or crackles, unlabored breathing, good air entry bilaterally HEART: Normal S1,S2 without murmurs EXTREMITIES: Without clubbing, cyanosis, or edema        Assessment & Plan:   Flu like  illness Likely flu Lives with husband - will prescribe tamiflu Rest, fluids Tylenol or advil for fever, aches otc cold meds for symptom relief Call if symptoms worsen or do not improve  Note for work - out of work for the rest of the week

## 2016-01-31 NOTE — Patient Instructions (Signed)

## 2016-04-29 ENCOUNTER — Other Ambulatory Visit: Payer: Self-pay | Admitting: Family

## 2016-05-08 ENCOUNTER — Encounter: Payer: Self-pay | Admitting: Family

## 2016-05-08 ENCOUNTER — Ambulatory Visit (INDEPENDENT_AMBULATORY_CARE_PROVIDER_SITE_OTHER): Payer: 59 | Admitting: Family

## 2016-05-08 VITALS — BP 168/100 | HR 67 | Temp 97.9°F | Resp 16 | Ht 63.0 in | Wt 165.8 lb

## 2016-05-08 DIAGNOSIS — I10 Essential (primary) hypertension: Secondary | ICD-10-CM | POA: Diagnosis not present

## 2016-05-08 MED ORDER — METOPROLOL TARTRATE 50 MG PO TABS: 50.0000 mg | ORAL_TABLET | Freq: Two times a day (BID) | ORAL | Status: DC

## 2016-05-08 MED ORDER — HYDROCHLOROTHIAZIDE 25 MG PO TABS: 25.0000 mg | ORAL_TABLET | Freq: Every day | ORAL | Status: DC

## 2016-05-08 MED FILL — METOPROLOL TARTRATE 50 MG T: 50 | 90 days supply | Qty: 180 | Fill #0

## 2016-05-08 MED FILL — HYDROCHLOROTHIAZIDE 25 MG T: 25 | 90 days supply | Qty: 90 | Fill #0

## 2016-05-08 NOTE — Progress Notes (Signed)
Subjective:    Patient ID: Yesenia Larson, female    DOB: Jun 17, 1956, 60 y.o.   MRN: [id number]  Chief Complaint  Patient presents with  . Medication Refill    needs refill of BP meds    HPI:  Yesenia Larson is a 60 y.o. female who  has a past medical history of Pelvic relaxation; Lichen sclerosus; H/O vitamin D deficiency; Osteopenia; Fibroids; Anemia; Irregular menses; amenorrhea; H/O gonorrhea; Candidiasis; Mild anemia; and Hypertension. and presents today for a follow up office visit.   1.) Hypertension - Currently maintained on metoprolol. Reports taking the medication as prescribed and denies adverse side effects or hypotensive readings. Blood pressures at home with the average reading being 156/85 over the last several weeks. Denies any symptoms of end organ damage.  BP Readings from Last 3 Encounters:  05/08/16 168/100  01/31/16 134/80  03/16/15 118/88    Allergies  Allergen Reactions  . Shrimp [Shellfish Allergy] Swelling     Current Outpatient Prescriptions on File Prior to Visit  Medication Sig Dispense Refill  . aspirin 81 MG tablet Take 81 mg by mouth daily.    . calcium carbonate 200 MG capsule Take 250 mg by mouth 2 (two) times daily with a meal.    . fish oil-omega-3 fatty acids 1000 MG capsule Take 2 g by mouth daily.    . Multiple Vitamin (MULTIVITAMIN) tablet Take 1 tablet by mouth daily.     No current facility-administered medications on file prior to visit.    Review of Systems  Constitutional: Negative for fever and chills.  Eyes:       Negative for changes in vision  Respiratory: Negative for cough, chest tightness and wheezing.   Cardiovascular: Negative for chest pain, palpitations and leg swelling.  Neurological: Negative for dizziness, weakness and light-headedness.      Objective:    BP 168/100 mmHg  Pulse 67  Temp(Src) 97.9 F (36.6 C) (Oral)  Resp 16  Ht 5\' 3"  (1.6 m)  Wt 165 lb 12.8 oz (75.206 kg)  BMI 29.38 kg/m2  SpO2  98% Nursing note and vital signs reviewed.  Physical Exam  Constitutional: She is oriented to person, place, and time. She appears well-developed and well-nourished. No distress.  Cardiovascular: Normal rate, regular rhythm, normal heart sounds and intact distal pulses.   Pulmonary/Chest: Effort normal and breath sounds normal.  Neurological: She is alert and oriented to person, place, and time.  Skin: Skin is warm and dry.  Psychiatric: She has a normal mood and affect. Her behavior is normal. Judgment and thought content normal.       Assessment & Plan:   Problem List Items Addressed This Visit      Cardiovascular and Mediastinum   Essential hypertension - Primary    Blood pressure remains above goal 140/90 with current regimen and no adverse side effects or symptoms of end organ damage. Continue current dosage of metoprolol. Start hydrochlorothiazide. Encouraged to monitor blood pressure at home. Decrease sodium intake. Follow-up in 2 weeks or sooner.      Relevant Medications   hydrochlorothiazide (HYDRODIURIL) 25 MG tablet   metoprolol (LOPRESSOR) 50 MG tablet       I have discontinued Ms. Stoneberg's oseltamivir. I have also changed her metoprolol. Additionally, I am having her start on hydrochlorothiazide. Lastly, I am having her maintain her multivitamin, fish oil-omega-3 fatty acids, aspirin, and calcium carbonate.   Meds ordered this encounter  Medications  . hydrochlorothiazide (HYDRODIURIL) 25 MG  tablet    Sig: Take 1 tablet (25 mg total) by mouth daily.    Dispense:  90 tablet    Refill:  3    Order Specific Question:  Supervising Provider    Answer:  Pricilla Holm A [id number]  . metoprolol (LOPRESSOR) 50 MG tablet    Sig: Take 1 tablet (50 mg total) by mouth 2 (two) times daily.    Dispense:  180 tablet    Refill:  3    Discontinue previous order of metoprolol    Order Specific Question:  Supervising Provider    Answer:  Pricilla Holm A [id number]     Follow-up: Return in about 2 weeks (around 05/22/2016), or if symptoms worsen or fail to improve.  Mauricio Po, FNP

## 2016-05-08 NOTE — Patient Instructions (Addendum)
Thank you for choosing [facility].  Summary/Instructions:  Please continue to take your medication as prescribed.  Continue to monitor your blood pressure at home.  Decrease salt in your diet.   Start hydrochlorothiazide daily.  Continue to monitor your blood pressure  Your prescription(s) have been submitted to your pharmacy or been printed and provided for you. Please take as directed and contact our office if you believe you are having problem(s) with the medication(s) or have any questions.  If your symptoms worsen or fail to improve, please contact our office for further instruction, or in case of emergency go directly to the emergency room at the closest medical facility.   DASH Eating Plan DASH stands for "Dietary Approaches to Stop Hypertension." The DASH eating plan is a healthy eating plan that has been shown to reduce high blood pressure (hypertension). Additional health benefits may include reducing the risk of type 2 diabetes mellitus, heart disease, and stroke. The DASH eating plan may also help with weight loss. WHAT DO I NEED TO KNOW ABOUT THE DASH EATING PLAN? For the DASH eating plan, you will follow these general guidelines:  Choose foods with a percent daily value for sodium of less than 5% (as listed on the food label).  Use salt-free seasonings or herbs instead of table salt or sea salt.  Check with your health care provider or pharmacist before using salt substitutes.  Eat lower-sodium products, often labeled as "lower sodium" or "no salt added."  Eat fresh foods.  Eat more vegetables, fruits, and low-fat dairy products.  Choose whole grains. Look for the word "whole" as the first word in the ingredient list.  Choose fish and skinless chicken or [place] more often than red meat. Limit fish, poultry, and meat to 6 oz (170 g) each day.  Limit sweets, desserts, sugars, and sugary drinks.  Choose heart-healthy fats.  Limit cheese to 1 oz (28 g) per  day.  Eat more home-cooked food and less restaurant, buffet, and fast food.  Limit fried foods.  Cook foods using methods other than frying.  Limit canned vegetables. If you do use them, rinse them well to decrease the sodium.  When eating at a restaurant, ask that your food be prepared with less salt, or no salt if possible. WHAT FOODS CAN I EAT? Seek help from a dietitian for individual calorie needs. Grains Whole grain or whole wheat bread. Brown rice. Whole grain or whole wheat pasta. Quinoa, bulgur, and whole grain cereals. Low-sodium cereals. Corn or whole wheat flour tortillas. Whole grain cornbread. Whole grain crackers. Low-sodium crackers. Vegetables Fresh or frozen vegetables (raw, steamed, roasted, or grilled). Low-sodium or reduced-sodium tomato and vegetable juices. Low-sodium or reduced-sodium tomato sauce and paste. Low-sodium or reduced-sodium canned vegetables.  Fruits All fresh, canned (in natural juice), or frozen fruits. Meat and Other Protein Products Ground beef (85% or leaner), grass-fed beef, or beef trimmed of fat. Skinless chicken or [place]. Ground chicken or [place]. Pork trimmed of fat. All fish and seafood. Eggs. Dried beans, peas, or lentils. Unsalted nuts and seeds. Unsalted canned beans. Dairy Low-fat dairy products, such as skim or 1% milk, 2% or reduced-fat cheeses, low-fat ricotta or cottage cheese, or plain low-fat yogurt. Low-sodium or reduced-sodium cheeses. Fats and Oils Tub margarines without trans fats. Light or reduced-fat mayonnaise and salad dressings (reduced sodium). Avocado. Safflower, olive, or canola oils. Natural peanut or almond butter. Other Unsalted popcorn and pretzels. The items listed above may not be a complete list of  recommended foods or beverages. Contact your dietitian for more options. WHAT FOODS ARE NOT RECOMMENDED? Grains White bread. White pasta. White rice. Refined cornbread. Bagels and croissants. Crackers that contain  trans fat. Vegetables Creamed or fried vegetables. Vegetables in a cheese sauce. Regular canned vegetables. Regular canned tomato sauce and paste. Regular tomato and vegetable juices. Fruits Dried fruits. Canned fruit in light or heavy syrup. Fruit juice. Meat and Other Protein Products Fatty cuts of meat. Ribs, chicken wings, bacon, sausage, bologna, salami, chitterlings, fatback, hot dogs, bratwurst, and packaged luncheon meats. Salted nuts and seeds. Canned beans with salt. Dairy Whole or 2% milk, cream, half-and-half, and cream cheese. Whole-fat or sweetened yogurt. Full-fat cheeses or blue cheese. Nondairy creamers and whipped toppings. Processed cheese, cheese spreads, or cheese curds. Condiments Onion and garlic salt, seasoned salt, table salt, and sea salt. Canned and packaged gravies. Worcestershire sauce. Tartar sauce. Barbecue sauce. Teriyaki sauce. Soy sauce, including reduced sodium. Steak sauce. Fish sauce. Oyster sauce. Cocktail sauce. Horseradish. Ketchup and mustard. Meat flavorings and tenderizers. Bouillon cubes. Hot sauce. Tabasco sauce. Marinades. Taco seasonings. Relishes. Fats and Oils Butter, stick margarine, lard, shortening, ghee, and bacon fat. Coconut, palm kernel, or palm oils. Regular salad dressings. Other Pickles and olives. Salted popcorn and pretzels. The items listed above may not be a complete list of foods and beverages to avoid. Contact your dietitian for more information. WHERE CAN I FIND MORE INFORMATION? National Heart, Lung, and Blood Institute: travelstabloid.com   This information is not intended to replace advice given to you by your health care provider. Make sure you discuss any questions you have with your health care provider.   Document Released: 11/14/2011 Document Revised: 12/16/2014 Document Reviewed: 09/29/2013 Elsevier Interactive Patient Education [employer].

## 2016-05-08 NOTE — Assessment & Plan Note (Signed)
Blood pressure remains above goal 140/90 with current regimen and no adverse side effects or symptoms of end organ damage. Continue current dosage of metoprolol. Start hydrochlorothiazide. Encouraged to monitor blood pressure at home. Decrease sodium intake. Follow-up in 2 weeks or sooner.

## 2016-05-08 NOTE — Progress Notes (Signed)
Pre visit review using our clinic review tool, if applicable. No additional management support is needed unless otherwise documented below in the visit note. 

## 2016-05-24 ENCOUNTER — Other Ambulatory Visit (INDEPENDENT_AMBULATORY_CARE_PROVIDER_SITE_OTHER): Payer: 59

## 2016-05-24 ENCOUNTER — Encounter: Payer: Self-pay | Admitting: Family

## 2016-05-24 ENCOUNTER — Ambulatory Visit (INDEPENDENT_AMBULATORY_CARE_PROVIDER_SITE_OTHER): Payer: 59 | Admitting: Family

## 2016-05-24 VITALS — BP 138/88 | HR 51 | Temp 97.7°F | Resp 16 | Ht 63.0 in | Wt 165.0 lb

## 2016-05-24 DIAGNOSIS — I10 Essential (primary) hypertension: Secondary | ICD-10-CM

## 2016-05-24 LAB — BASIC METABOLIC PANEL
BUN: 14 mg/dL (ref 6–23)
CHLORIDE: 100 meq/L (ref 96–112)
CO2: 34 meq/L — AB (ref 19–32)
CREATININE: 0.69 mg/dL (ref 0.40–1.20)
Calcium: 9.7 mg/dL (ref 8.4–10.5)
GFR: 110.81 mL/min (ref >60.00)
GLUCOSE: 95 mg/dL (ref 70–99)
Potassium: 3.3 mEq/L — ABNORMAL LOW (ref 3.5–5.1)
Sodium: 139 mEq/L (ref 135–145)

## 2016-05-24 NOTE — Patient Instructions (Addendum)
Thank you for choosing [facility].  Summary/Instructions:  Please continue to take your medications as prescribed.  Continue with decreasing sodium in diet.   Please stop by the lab on the basement level of the building for your blood work. Your results will be released to [facility] (or called to you) after review, usually within 72 hours after test completion. If any changes need to be made, you will be notified at that same time.  If your symptoms worsen or fail to improve, please contact our office for further instruction, or in case of emergency go directly to the emergency room at the closest medical facility.

## 2016-05-24 NOTE — Assessment & Plan Note (Signed)
Blood pressure is below goal 140/90 with current regimen and denies adverse side effects. Home blood pressures are below goal as well. Denies adverse symptoms or worse headache of life. Continue current dosage of metoprolol and hydrochlorothiazide. Continue with decreasing sodium in diet. Follow-up in 6 months or sooner if needed.

## 2016-05-24 NOTE — Progress Notes (Signed)
Subjective:    Patient ID: Yesenia Larson, female    DOB: 05-21-1956, 60 y.o.   MRN: [id number]  Chief Complaint  Patient presents with  . Follow-up    states that medication for bp is working better since being on HCTZ    HPI:  Yesenia Larson is a 60 y.o. female who  has a past medical history of Pelvic relaxation; Lichen sclerosus; H/O vitamin D deficiency; Osteopenia; Fibroids; Anemia; Irregular menses; amenorrhea; H/O gonorrhea; Candidiasis; Mild anemia; and Hypertension. and presents today for a follow up office visit.   1.) Hypertension - Currently maintained on hydrochlorothiazide and metroprolol. Reports taking the medication as prescribed and denies adverse side effects. Notes that her blood pressures at home have been around 130s/70s. No hypotensive readings. Denies symptoms of end organ damage.   BP Readings from Last 3 Encounters:  05/24/16 138/88  05/08/16 168/100  01/31/16 134/80     Allergies  Allergen Reactions  . Shrimp [Shellfish Allergy] Swelling     Current Outpatient Prescriptions on File Prior to Visit  Medication Sig Dispense Refill  . aspirin 81 MG tablet Take 81 mg by mouth daily.    . calcium carbonate 200 MG capsule Take 250 mg by mouth 2 (two) times daily with a meal.    . fish oil-omega-3 fatty acids 1000 MG capsule Take 2 g by mouth daily.    . hydrochlorothiazide (HYDRODIURIL) 25 MG tablet Take 1 tablet (25 mg total) by mouth daily. 90 tablet 3  . metoprolol (LOPRESSOR) 50 MG tablet Take 1 tablet (50 mg total) by mouth 2 (two) times daily. 180 tablet 3  . Multiple Vitamin (MULTIVITAMIN) tablet Take 1 tablet by mouth daily.     No current facility-administered medications on file prior to visit.    Review of Systems  Constitutional: Negative for fever and chills.  Eyes:       Negative for changes in vision  Respiratory: Negative for cough, chest tightness and wheezing.   Cardiovascular: Negative for chest pain, palpitations and leg  swelling.  Neurological: Negative for dizziness, weakness and light-headedness.      Objective:    BP 138/88 mmHg  Pulse 51  Temp(Src) 97.7 F (36.5 C) (Oral)  Resp 16  Ht 5\' 3"  (1.6 m)  Wt 165 lb (74.844 kg)  BMI 29.24 kg/m2  SpO2 99% Nursing note and vital signs reviewed.  Physical Exam  Constitutional: She is oriented to person, place, and time. She appears well-developed and well-nourished. No distress.  Cardiovascular: Normal rate, regular rhythm, normal heart sounds and intact distal pulses.   Pulmonary/Chest: Effort normal and breath sounds normal.  Neurological: She is alert and oriented to person, place, and time.  Skin: Skin is warm and dry.  Psychiatric: She has a normal mood and affect. Her behavior is normal. Judgment and thought content normal.       Assessment & Plan:   Problem List Items Addressed This Visit      Cardiovascular and Mediastinum   Essential hypertension - Primary    Blood pressure is below goal 140/90 with current regimen and denies adverse side effects. Home blood pressures are below goal as well. Denies adverse symptoms or worse headache of life. Continue current dosage of metoprolol and hydrochlorothiazide. Continue with decreasing sodium in diet. Follow-up in 6 months or sooner if needed.       Relevant Orders   Basic Metabolic Panel (BMET)       I am having Ms. Yesenia Larson  maintain her multivitamin, fish oil-omega-3 fatty acids, aspirin, calcium carbonate, hydrochlorothiazide, and metoprolol.   Follow-up: Return in about 6 months (around 11/23/2016).  Mauricio Po, FNP

## 2016-05-24 NOTE — Progress Notes (Signed)
Pre visit review using our clinic review tool, if applicable. No additional management support is needed unless otherwise documented below in the visit note. 

## 2016-05-28 ENCOUNTER — Telehealth: Payer: Self-pay | Admitting: Family

## 2016-05-28 NOTE — Telephone Encounter (Signed)
Please inform patient that her blood work is within the normal ranges. Please continue to take medications as prescribed and follow up in 6 months.

## 2016-05-29 NOTE — Telephone Encounter (Signed)
Pt aware of results. Pt did get copy of results.

## 2016-08-02 MED FILL — HYDROCHLOROTHIAZIDE 25 MG T: 25 | 90 days supply | Qty: 90 | Fill #1

## 2016-08-02 MED FILL — METOPROLOL TARTRATE 50 MG T: 50 | 90 days supply | Qty: 180 | Fill #1

## 2016-08-06 DIAGNOSIS — Z01419 Encounter for gynecological examination (general) (routine) without abnormal findings: Secondary | ICD-10-CM | POA: Diagnosis not present

## 2016-08-06 DIAGNOSIS — Z6828 Body mass index (BMI) 28.0-28.9, adult: Secondary | ICD-10-CM | POA: Diagnosis not present

## 2016-08-06 DIAGNOSIS — Z6829 Body mass index (BMI) 29.0-29.9, adult: Secondary | ICD-10-CM | POA: Diagnosis not present

## 2016-08-06 DIAGNOSIS — N813 Complete uterovaginal prolapse: Secondary | ICD-10-CM | POA: Diagnosis not present

## 2016-10-10 DIAGNOSIS — Z1231 Encounter for screening mammogram for malignant neoplasm of breast: Secondary | ICD-10-CM | POA: Diagnosis not present

## 2016-10-24 MED FILL — HYDROCHLOROTHIAZIDE 25 MG T: 25 | 90 days supply | Qty: 90 | Fill #2

## 2016-10-24 MED FILL — METOPROLOL TARTRATE 50 MG T: 50 | 90 days supply | Qty: 180 | Fill #2

## 2016-11-12 DIAGNOSIS — H16223 Keratoconjunctivitis sicca, not specified as Sjogren's, bilateral: Secondary | ICD-10-CM | POA: Diagnosis not present

## 2016-11-12 DIAGNOSIS — H2513 Age-related nuclear cataract, bilateral: Secondary | ICD-10-CM | POA: Diagnosis not present

## 2016-11-12 MED FILL — RESTASIS MULTIDOSE 0.05% EY: 0.05 | 90 days supply | Qty: 17 | Fill #0

## 2017-01-15 NOTE — Progress Notes (Signed)
[facility] [place], [place] [address] Phone: [phone] Subjective:    I'm seeing this patient by the request  of:  Mauricio Po, FNP   CC:  Bilateral shoulder pain   [id number]  Yesenia Larson is a 61 y.o. female coming in with complaint of bilateral shoulder pain. Rates record and left. States that as a dull, throbbing aching sensation. Worse with certain activities. Sometimes a sharp pain with movement that seems to resolve after couple seconds. Patient states he can be uncomfortable at night. Patient states sometimes it seems to radiate down towards her hands but not all the time. Mild neck pain associated with it. Patient has not tried any significant over-the-counter medications or icing regimen. Rates the severity of pain though is 4 out of 10 on a daily basis.     Past Medical History:  Diagnosis Date  . Anemia    H/O  . Candidiasis    H/O  . Fibroids   . H/O gonorrhea   . H/O vitamin D deficiency   . Hx of amenorrhea   . Hypertension   . Irregular menses    H/O  . Lichen sclerosus   . Mild anemia   . Osteopenia   . Pelvic relaxation    Past Surgical History:  Procedure Laterality Date  . DILATION AND EVACUATION  1994  . TONSILLECTOMY    . VULVA /PERINEUM BIOPSY  02/2008   Social History   Social History  . Marital status: Married    Spouse name: N/A  . Number of children: N/A  . Years of education: N/A   Social History Main Topics  . Smoking status: Never Smoker  . Smokeless tobacco: Never Used  . Alcohol use No  . Drug use: No  . Sexual activity: Yes    Birth control/ protection: Post-menopausal   Other Topics Concern  . None   Social History Narrative  . None   Allergies  Allergen Reactions  . Shrimp [Shellfish Allergy] Swelling   Family History  Problem Relation Age of Onset  . Colon polyps Sister   . Diabetes Sister   . Diabetes Brother   . Diabetes Sister     Past medical  history, social, surgical and family history all reviewed in electronic medical record.  No pertanent information unless stated regarding to the chief complaint.   Review of Systems:Review of systems updated and as accurate as of 01/16/17  No headache, visual changes, nausea, vomiting, diarrhea, constipation, dizziness, abdominal pain, skin rash, fevers, chills, night sweats, weight loss, swollen lymph nodes, body aches, joint swelling, , chest pain, shortness of breath, mood changes.   Objective  Blood pressure 126/82, pulse 64, height 5\' 3"  (1.6 m), weight 166 lb (75.3 kg), SpO2 99 %. Systems examined below as of 01/16/17   General: No apparent distress alert and oriented x3 mood and affect normal, dressed appropriately.  HEENT: Pupils equal, extraocular movements intact  Respiratory: Patient's speak in full sentences and does not appear short of breath  Cardiovascular: No lower extremity edema, non tender, no erythema  Skin: Warm dry intact with no signs of infection or rash on extremities or on axial skeleton.  Abdomen: Soft nontender  Neuro: Cranial nerves II through XII are intact, neurovascularly intact in all extremities with 2+ DTRs and 2+ pulses.  Lymph: No lymphadenopathy of posterior or anterior cervical chain or axillae bilaterally.  Gait normal with good balance and coordination.  MSK:  Non  tender with full range of motion and good stability and symmetric strength and tone of  elbows, wrist, hip, knee and ankles bilaterally.  Shoulder: Bilateral Inspection reveals no abnormalities, atrophy or asymmetry. Palpation is normal with no tenderness over AC joint or bicipital groove. ROM is full in all planes passively. Rotator cuff strength normal throughout. signs of impingement with positive Neer and Hawkin's tests, but negative empty can sign. Speeds and Yergason's tests positive. No labral pathology noted with negative Obrien's, negative clunk and good stability. Normal scapular  function observed. No painful arc and no drop arm sign. No apprehension sign  MSK US performed of: Right This study was ordered, performed, and interpreted by Charlann Boxer D.O.  Shoulder:   Supraspinatus:  Appears normal on long and transverse views, Bursal bulge seen with shoulder abduction on impingement view. Mild in nature Infraspinatus:  Appears normal on long and transverse views.  Subscapularis:  Appears normal on long and transverse views.  Teres Minor:  Appears normal on long and transverse views. AC joint:  Capsule undistended, no geyser sign. Glenohumeral Joint:  Appears normal without effusion. Glenoid Labrum:  Intact without visualized tears. Biceps Tendon:  Significant hypoechoic changes within the tendon sheath. Patient does have a very shallow groove bilaterally.  Impression: Subacromial bursitis bicep tendinitis with likely chronic subluxation   Procedure note [id number]; 15 minutes spent for Therapeutic exercises as stated in above notes.  This included exercises focusing on stretching, strengthening, with significant focus on eccentric aspects.  Shoulder Exercises that included:  Basic scapular stabilization to include adduction and depression of scapula Scaption, focusing on proper movement and good control Internal and External rotation utilizing a theraband, with elbow tucked at side entire time Rows with theraband   Proper technique shown and discussed handout in great detail with ATC.  All questions were discussed and answered.     Impression and Recommendations:     This case required medical decision making of moderate complexity.      Note: This dictation was prepared with Dragon dictation along with smaller phrase technology. Any transcriptional errors that result from this process are unintentional.

## 2017-01-16 ENCOUNTER — Ambulatory Visit (INDEPENDENT_AMBULATORY_CARE_PROVIDER_SITE_OTHER): Payer: 59 | Admitting: Family Medicine

## 2017-01-16 ENCOUNTER — Ambulatory Visit: Payer: Self-pay

## 2017-01-16 ENCOUNTER — Encounter: Payer: Self-pay | Admitting: Family Medicine

## 2017-01-16 VITALS — BP 126/82 | HR 64 | Ht 63.0 in | Wt 166.0 lb

## 2017-01-16 DIAGNOSIS — M25512 Pain in left shoulder: Secondary | ICD-10-CM

## 2017-01-16 DIAGNOSIS — M7521 Bicipital tendinitis, right shoulder: Secondary | ICD-10-CM

## 2017-01-16 DIAGNOSIS — M25511 Pain in right shoulder: Secondary | ICD-10-CM | POA: Diagnosis not present

## 2017-01-16 DIAGNOSIS — G8929 Other chronic pain: Secondary | ICD-10-CM | POA: Diagnosis not present

## 2017-01-16 DIAGNOSIS — M7522 Bicipital tendinitis, left shoulder: Secondary | ICD-10-CM

## 2017-01-16 MED ORDER — DICLOFENAC SODIUM 2 % TD SOLN: 2.0000 "application " | Freq: Two times a day (BID) | TRANSDERMAL | 3 refills | Status: DC

## 2017-01-16 NOTE — Patient Instructions (Signed)
Good to see you  Ice 20 minutes 2 times daily. Usually after activity and before bed. Exercises 3 times a week.  Compression sleeve with activity  pennsaid pinkie amount topically 2 times daily as needed.  Try to keep hands within peripheral vision and lift with palms down See me again in 4 weeks.

## 2017-01-16 NOTE — Assessment & Plan Note (Signed)
Patient is a bicep tendinitis with a shallow bicipital groove is likely causing subluxation. We discussed icing regimen, home exercises, which activities to do in which ones to avoid. Work with Product/process development scientist to learn these in greater detail. Topical anti-inflammatory's prescribed. Discussed proper lifting mechanics. Follow-up again in 4 weeks. If no improvement consider further workup patient's neck, possible tendon sheath injections, as well as formal physical therapy.

## 2017-02-03 MED FILL — HYDROCHLOROTHIAZIDE 25 MG T: 25 | 90 days supply | Qty: 90 | Fill #3

## 2017-03-17 MED FILL — METOPROLOL TARTRATE 50 MG T: 50 | 90 days supply | Qty: 180 | Fill #3

## 2017-04-02 ENCOUNTER — Other Ambulatory Visit: Payer: Self-pay | Admitting: Occupational Medicine

## 2017-04-02 ENCOUNTER — Ambulatory Visit: Payer: Self-pay

## 2017-04-02 DIAGNOSIS — M79641 Pain in right hand: Secondary | ICD-10-CM

## 2017-04-04 DIAGNOSIS — S62652A Nondisplaced fracture of medial phalanx of right middle finger, initial encounter for closed fracture: Secondary | ICD-10-CM | POA: Diagnosis not present

## 2017-04-04 DIAGNOSIS — S62654A Nondisplaced fracture of medial phalanx of right ring finger, initial encounter for closed fracture: Secondary | ICD-10-CM | POA: Diagnosis not present

## 2017-04-16 ENCOUNTER — Ambulatory Visit (INDEPENDENT_AMBULATORY_CARE_PROVIDER_SITE_OTHER): Payer: 59 | Admitting: Family

## 2017-04-16 ENCOUNTER — Encounter: Payer: Self-pay | Admitting: Family

## 2017-04-16 VITALS — BP 128/80 | HR 67 | Temp 98.3°F | Resp 16 | Ht 63.0 in | Wt 163.0 lb

## 2017-04-16 DIAGNOSIS — I10 Essential (primary) hypertension: Secondary | ICD-10-CM

## 2017-04-16 DIAGNOSIS — J069 Acute upper respiratory infection, unspecified: Secondary | ICD-10-CM | POA: Diagnosis not present

## 2017-04-16 MED ORDER — AZITHROMYCIN 250 MG PO TABS: ORAL_TABLET | ORAL | 0 refills | Status: DC

## 2017-04-16 MED ORDER — HYDROCHLOROTHIAZIDE 25 MG PO TABS: 25.0000 mg | ORAL_TABLET | Freq: Every day | ORAL | 3 refills | Status: DC

## 2017-04-16 MED ORDER — METOPROLOL TARTRATE 50 MG PO TABS: 50.0000 mg | ORAL_TABLET | Freq: Two times a day (BID) | ORAL | 3 refills | Status: DC

## 2017-04-16 MED ORDER — PREDNISONE 10 MG (21) PO TBPK: ORAL_TABLET | ORAL | 0 refills | Status: DC

## 2017-04-16 MED FILL — predniSONE 10 MG TABS: 10 | 6 days supply | Qty: 21 | Fill #0

## 2017-04-16 NOTE — Progress Notes (Signed)
Subjective:    Patient ID: Yesenia Larson, female    DOB: 22-Feb-1956, 61 y.o.   MRN: [id number]  Chief Complaint  Patient presents with  . Sore Throat    cough, congestion and sore throat, x1 week     HPI:  Yesenia Larson is a 61 y.o. female who  has a past medical history of Anemia; Candidiasis; Fibroids; H/O gonorrhea; H/O vitamin D deficiency; amenorrhea; Hypertension; Irregular menses; Lichen sclerosus; Mild anemia; Osteopenia; and Pelvic relaxation. and presents today for an acute office visit.   This is a new problem. Associated symptoms of cough, congestion and sore throat has been going on for about 1 week. Symptoms initially improved, and then worsened again. No fevers. Modifying factors include Alka-seltzer cold/cough and cough drops which help a little with symptoms but has not altered the course of the symptoms. No recent antibiotics.    Allergies  Allergen Reactions  . Shrimp [Shellfish Allergy] Swelling      Outpatient Medications Prior to Visit  Medication Sig Dispense Refill  . aspirin 81 MG tablet Take 81 mg by mouth daily.    . calcium carbonate 200 MG capsule Take 250 mg by mouth 2 (two) times daily with a meal.    . Diclofenac Sodium (PENNSAID) 2 % SOLN Place 2 application onto the skin 2 (two) times daily. 112 g 3  . fish oil-omega-3 fatty acids 1000 MG capsule Take 2 g by mouth daily.    . Multiple Vitamin (MULTIVITAMIN) tablet Take 1 tablet by mouth daily.    . hydrochlorothiazide (HYDRODIURIL) 25 MG tablet Take 1 tablet (25 mg total) by mouth daily. 90 tablet 3  . metoprolol (LOPRESSOR) 50 MG tablet Take 1 tablet (50 mg total) by mouth 2 (two) times daily. 180 tablet 3   No facility-administered medications prior to visit.      Review of Systems  Constitutional: Negative for chills and fever.  HENT: Positive for congestion, ear pain and sore throat. Negative for sinus pain and sinus pressure.   Respiratory: Positive for cough and wheezing. Negative  for chest tightness and shortness of breath.   Gastrointestinal: Negative for diarrhea and vomiting.  Neurological: Negative for headaches.      Objective:    BP 128/80 (BP Location: Left Arm, Patient Position: Sitting, Cuff Size: Large)   Pulse 67   Temp 98.3 F (36.8 C) (Oral)   Resp 16   Ht 5\' 3"  (1.6 m)   Wt 163 lb (73.9 kg)   SpO2 98%   BMI 28.87 kg/m  Nursing note and vital signs reviewed.  Physical Exam  Constitutional: She is oriented to person, place, and time. She appears well-developed and well-nourished. No distress.  HENT:  Right Ear: Hearing, tympanic membrane, external ear and ear canal normal.  Left Ear: Hearing, tympanic membrane, external ear and ear canal normal.  Nose: Nose normal.  Mouth/Throat: Uvula is midline, oropharynx is clear and moist and mucous membranes are normal.  Cardiovascular: Normal rate, regular rhythm, normal heart sounds and intact distal pulses.   Pulmonary/Chest: Effort normal and breath sounds normal.  Neurological: She is alert and oriented to person, place, and time.  Skin: Skin is warm and dry.  Psychiatric: She has a normal mood and affect. Her behavior is normal. Judgment and thought content normal.       Assessment & Plan:   Problem List Items Addressed This Visit      Cardiovascular and Mediastinum   Essential hypertension  Relevant Medications   hydrochlorothiazide (HYDRODIURIL) 25 MG tablet   metoprolol (LOPRESSOR) 50 MG tablet     Respiratory   Acute upper respiratory infection - Primary    Symptoms and exam consistent with acute upper respiratory infection most likely viral. Continue with over-the-counter medications as needed for symptom relief and supportive care. Start prednisone Dosepak. If no improvement in 3-5 days start azithromycin. Follow-up if symptoms worsen or do not improve.      Relevant Medications   azithromycin (ZITHROMAX) 250 MG tablet       I am having Ms. Haberer start on azithromycin and  predniSONE. I am also having her maintain her multivitamin, fish oil-omega-3 fatty acids, aspirin, calcium carbonate, Diclofenac Sodium, hydrochlorothiazide, and metoprolol.   Meds ordered this encounter  Medications  . hydrochlorothiazide (HYDRODIURIL) 25 MG tablet    Sig: Take 1 tablet (25 mg total) by mouth daily.    Dispense:  90 tablet    Refill:  3  . metoprolol (LOPRESSOR) 50 MG tablet    Sig: Take 1 tablet (50 mg total) by mouth 2 (two) times daily.    Dispense:  180 tablet    Refill:  3    Discontinue previous order of metoprolol  . azithromycin (ZITHROMAX) 250 MG tablet    Sig: Take 2 tablets by mouth for 1 day then 1 tablet by mouth daily for 4 days.    Dispense:  6 tablet    Refill:  0    Order Specific Question:   Supervising Provider    Answer:   Pricilla Holm A [[id number]]  . predniSONE (STERAPRED UNI-PAK 21 TAB) 10 MG (21) TBPK tablet    Sig: Take 6 tablets x 1 day, 5 tablets x 1 day, 4 tablets x 1 day, 3 tablets x 1 day, 2 tablets x 1 day, 1 tablet x 1 day    Dispense:  21 tablet    Refill:  0    Order Specific Question:   Supervising Provider    Answer:   Pricilla Holm A [[id number]]     Follow-up: Return if symptoms worsen or fail to improve.  Mauricio Po, FNP

## 2017-04-16 NOTE — Patient Instructions (Addendum)
Thank you for choosing [facility].  SUMMARY AND INSTRUCTIONS:  Please continue to take your medications as prescribed.  Continue with over the counter medications and if your symptoms worsen in the next 3-5 days start the antibiotic that was provided.  Medication:  Your prescription(s) have been submitted to your pharmacy or been printed and provided for you. Please take as directed and contact our office if you believe you are having problem(s) with the medication(s) or have any questions.  Follow up:  If your symptoms worsen or fail to improve, please contact our office for further instruction, or in case of emergency go directly to the emergency room at the closest medical facility.   General Recommendations:    Please drink plenty of fluids.  Get plenty of rest   Sleep in humidified air  Use saline nasal sprays  Netti pot   OTC Medications:  Decongestants - helps relieve congestion   Flonase (generic fluticasone) or Nasacort (generic triamcinolone) - please make sure to use the "cross-over" technique at a 45 degree angle towards the opposite eye as opposed to straight up the nasal passageway.   Sudafed (generic pseudoephedrine - Note this is the one that is available behind the pharmacy counter); Products with phenylephrine (-PE) may also be used but is often not as effective as pseudoephedrine.   If you have HIGH BLOOD PRESSURE - Coricidin HBP; AVOID any product that is -D as this contains pseudoephedrine which may increase your blood pressure.  Afrin (oxymetazoline) every 6-8 hours for up to 3 days.   Allergies - helps relieve runny nose, itchy eyes and sneezing   Claritin (generic loratidine), Allegra (fexofenidine), or Zyrtec (generic cyrterizine) for runny nose. These medications should not cause drowsiness.  Note - Benadryl (generic diphenhydramine) may be used however may cause drowsiness  Cough -   Delsym or Robitussin (generic  dextromethorphan)  Expectorants - helps loosen mucus to ease removal   Mucinex (generic guaifenesin) as directed on the package.  Headaches / General Aches   Tylenol (generic acetaminophen) - DO NOT EXCEED 3 grams (3,000 mg) in a 24 hour time period  Advil/Motrin (generic ibuprofen)   Sore Throat -   Salt water gargle   Chloraseptic (generic benzocaine) spray or lozenges / Sucrets (generic dyclonine)

## 2017-04-16 NOTE — Assessment & Plan Note (Signed)
Symptoms and exam consistent with acute upper respiratory infection most likely viral. Continue with over-the-counter medications as needed for symptom relief and supportive care. Start prednisone Dosepak. If no improvement in 3-5 days start azithromycin. Follow-up if symptoms worsen or do not improve.

## 2017-04-18 DIAGNOSIS — S62652D Nondisplaced fracture of medial phalanx of right middle finger, subsequent encounter for fracture with routine healing: Secondary | ICD-10-CM | POA: Diagnosis not present

## 2017-04-18 DIAGNOSIS — S62654D Nondisplaced fracture of medial phalanx of right ring finger, subsequent encounter for fracture with routine healing: Secondary | ICD-10-CM | POA: Diagnosis not present

## 2017-05-02 MED FILL — HYDROCHLOROTHIAZIDE 25 MG T: 25 | 90 days supply | Qty: 90 | Fill #0

## 2017-05-19 DIAGNOSIS — S62652D Nondisplaced fracture of medial phalanx of right middle finger, subsequent encounter for fracture with routine healing: Secondary | ICD-10-CM | POA: Diagnosis not present

## 2017-05-19 DIAGNOSIS — S62654D Nondisplaced fracture of medial phalanx of right ring finger, subsequent encounter for fracture with routine healing: Secondary | ICD-10-CM | POA: Diagnosis not present

## 2017-06-17 DIAGNOSIS — S62652D Nondisplaced fracture of medial phalanx of right middle finger, subsequent encounter for fracture with routine healing: Secondary | ICD-10-CM | POA: Diagnosis not present

## 2017-06-17 DIAGNOSIS — S62654D Nondisplaced fracture of medial phalanx of right ring finger, subsequent encounter for fracture with routine healing: Secondary | ICD-10-CM | POA: Diagnosis not present

## 2017-08-18 MED FILL — METOPROLOL TARTRATE 50 MG T: 50 | 90 days supply | Qty: 180 | Fill #0

## 2017-08-18 MED FILL — HYDROCHLOROTHIAZIDE 25 MG T: 25 | 90 days supply | Qty: 90 | Fill #1

## 2017-10-06 DIAGNOSIS — N950 Postmenopausal bleeding: Secondary | ICD-10-CM | POA: Diagnosis not present

## 2017-10-06 DIAGNOSIS — Z1321 Encounter for screening for nutritional disorder: Secondary | ICD-10-CM | POA: Diagnosis not present

## 2017-10-06 DIAGNOSIS — Z01419 Encounter for gynecological examination (general) (routine) without abnormal findings: Secondary | ICD-10-CM | POA: Diagnosis not present

## 2017-10-06 DIAGNOSIS — Z6828 Body mass index (BMI) 28.0-28.9, adult: Secondary | ICD-10-CM | POA: Diagnosis not present

## 2017-10-06 DIAGNOSIS — Z1231 Encounter for screening mammogram for malignant neoplasm of breast: Secondary | ICD-10-CM | POA: Diagnosis not present

## 2017-10-06 DIAGNOSIS — N814 Uterovaginal prolapse, unspecified: Secondary | ICD-10-CM | POA: Diagnosis not present

## 2017-10-06 DIAGNOSIS — M8589 Other specified disorders of bone density and structure, multiple sites: Secondary | ICD-10-CM | POA: Diagnosis not present

## 2017-10-06 LAB — HM MAMMOGRAPHY

## 2017-10-22 DIAGNOSIS — N879 Dysplasia of cervix uteri, unspecified: Secondary | ICD-10-CM | POA: Diagnosis not present

## 2017-10-22 DIAGNOSIS — N950 Postmenopausal bleeding: Secondary | ICD-10-CM | POA: Diagnosis not present

## 2017-11-10 MED FILL — HYDROCHLOROTHIAZIDE 25 MG T: 25 | 90 days supply | Qty: 90 | Fill #2

## 2017-12-03 DIAGNOSIS — N939 Abnormal uterine and vaginal bleeding, unspecified: Secondary | ICD-10-CM | POA: Diagnosis not present

## 2017-12-03 DIAGNOSIS — N813 Complete uterovaginal prolapse: Secondary | ICD-10-CM | POA: Diagnosis not present

## 2017-12-05 DIAGNOSIS — H43813 Vitreous degeneration, bilateral: Secondary | ICD-10-CM | POA: Diagnosis not present

## 2017-12-05 DIAGNOSIS — H2513 Age-related nuclear cataract, bilateral: Secondary | ICD-10-CM | POA: Diagnosis not present

## 2017-12-05 DIAGNOSIS — H16223 Keratoconjunctivitis sicca, not specified as Sjogren's, bilateral: Secondary | ICD-10-CM | POA: Diagnosis not present

## 2017-12-17 DIAGNOSIS — N813 Complete uterovaginal prolapse: Secondary | ICD-10-CM | POA: Diagnosis not present

## 2018-01-05 ENCOUNTER — Encounter: Payer: Self-pay | Admitting: Family

## 2018-01-05 ENCOUNTER — Ambulatory Visit: Payer: 59 | Admitting: Family

## 2018-01-05 VITALS — BP 118/80 | HR 66 | Temp 98.4°F | Wt 166.0 lb

## 2018-01-05 DIAGNOSIS — L299 Pruritus, unspecified: Secondary | ICD-10-CM

## 2018-01-05 DIAGNOSIS — I10 Essential (primary) hypertension: Secondary | ICD-10-CM | POA: Diagnosis not present

## 2018-01-05 DIAGNOSIS — L509 Urticaria, unspecified: Secondary | ICD-10-CM

## 2018-01-05 MED ORDER — METHYLPREDNISOLONE ACETATE 40 MG/ML IJ SUSP
40.0000 mg | Freq: Once | INTRAMUSCULAR | Status: AC
  Administered 2018-01-05: 40 mg via INTRAMUSCULAR

## 2018-01-05 MED ORDER — METOPROLOL TARTRATE 50 MG PO TABS: 50.0000 mg | ORAL_TABLET | Freq: Every day | ORAL | 3 refills | Status: DC

## 2018-01-05 NOTE — Progress Notes (Signed)
Yesenia Larson is a 62 y.o. female with the following history as recorded in [employer]:  Patient Active Problem List   Diagnosis Date Noted  . Biceps tendinitis of both shoulders 01/16/2017  . Acute upper respiratory infection 03/16/2015  . Essential hypertension 01/13/2015  . Lightheadedness 12/28/2014  . Pelvic relaxation   . Lichen sclerosus   . H/O vitamin D deficiency   . Osteopenia   . Fibroids   . Anemia   . Irregular menses   . Hx of amenorrhea   . H/O gonorrhea   . Mild anemia   . 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 calcium carbonate 200 MG capsule Take 250 mg by mouth 2 (two) times daily with a meal.    . Diclofenac Sodium (PENNSAID) 2 % SOLN Place 2 application onto the skin 2 (two) times daily. 112 g 3  . fish oil-omega-3 fatty acids 1000 MG capsule Take 2 g by mouth daily.    . hydrochlorothiazide (HYDRODIURIL) 25 MG tablet Take 1 tablet (25 mg total) by mouth daily. 90 tablet 3  . metoprolol tartrate (LOPRESSOR) 50 MG tablet Take 1 tablet (50 mg total) by mouth daily. 180 tablet 3  . Multiple Vitamin (MULTIVITAMIN) tablet Take 1 tablet by mouth daily.     No current facility-administered medications for this visit.     Allergies: Shrimp [shellfish allergy]  Past Medical History:  Diagnosis Date  . Anemia    H/O  . Candidiasis    H/O  . Fibroids   . H/O gonorrhea   . H/O vitamin D deficiency   . Hx of amenorrhea   . Hypertension   . Irregular menses    H/O  . Lichen sclerosus   . Mild anemia   . Osteopenia   . Pelvic relaxation     Past Surgical History:  Procedure Laterality Date  . DILATION AND EVACUATION  1994  . TONSILLECTOMY    . VULVA /PERINEUM BIOPSY  02/2008    Family History  Problem Relation Age of Onset  . Colon polyps Sister   . Diabetes Sister   . Diabetes Brother   . Diabetes Sister     Social History   Tobacco Use  . Smoking status: Never Smoker  . Smokeless tobacco:  Never Used  Substance Use Topics  . Alcohol use: No    Subjective:  Notes that she accidentally was exposed to food cooked with shrimp last Thursday; has severe allergy to shrimp/ shellfish; lips swelled almost immediately and has been having recurrent "welts" on her chest; has been taking Benadryl with some relief of her symptoms; lips have normalized but continuing with symptoms on her chest/ beneath her breasts;   Objective:  Vitals:   01/05/18 1450  BP: 118/80  Pulse: 66  Temp: 98.4 F (36.9 C)  SpO2: 98%  Weight: 166 lb (75.3 kg)    General: Well developed, well nourished, in no acute distress  Skin : Warm and dry. Mild erythema noted on both sides of chest wall Head: Normocephalic and atraumatic  Lungs: Respirations unlabored; clear to auscultation bilaterally without wheeze, rales, rhonchi  CVS exam: normal rate and regular rhythm.  Neurologic: Alert and oriented; speech intact; face symmetrical; moves all extremities well; CNII-XII intact without focal deficit  Assessment:  1. Localized hives   2. Itching   3. Essential hypertension     Plan:  1. & 2.  Continue Benadryl as needed; Depo-Medrol IM  40 given in office today; 3. Stable; continue same medications; follow-up with new PCP in about 4 months to establish care.   Return in about 4 months (around 05/05/2018) for [employer]  No orders of the defined types were placed in this encounter.   Requested Prescriptions   Signed Prescriptions Disp Refills  . metoprolol tartrate (LOPRESSOR) 50 MG tablet 180 tablet 3    Sig: Take 1 tablet (50 mg total) by mouth daily.

## 2018-01-07 ENCOUNTER — Ambulatory Visit: Payer: Self-pay

## 2018-01-07 NOTE — Telephone Encounter (Signed)
Returned call to the pt. to discuss continued c/o itching and burning beneath her breasts.  Questioned if the Cortisone shot that she rec'd on Monday, should have improved her symptoms by now? Reported the symptoms are not worse, but not any better.  Stated that she can't take the Benadryl at work, and has tried to rub where it itches,  instead of scratching the area.  Reported redness from rubbing area.  Denied any blisters, moisture, or open areas beneath the breasts.  Advised to give the cortisone injection more time to work, and if symptoms are not improving by Friday, to call back. Recommended patient to place a handkerchief, or part of an old pillow case, beneath the breasts to prevent the skin from rubbing against itself. Verb. understanding.

## 2018-01-08 MED FILL — NYSTATIN 100,000 UNIT/GM CR: 100000 | 10 days supply | Qty: 15 | Fill #0

## 2018-01-15 DIAGNOSIS — N813 Complete uterovaginal prolapse: Secondary | ICD-10-CM | POA: Diagnosis not present

## 2018-01-15 DIAGNOSIS — Z79899 Other long term (current) drug therapy: Secondary | ICD-10-CM | POA: Diagnosis not present

## 2018-01-15 DIAGNOSIS — K219 Gastro-esophageal reflux disease without esophagitis: Secondary | ICD-10-CM | POA: Diagnosis not present

## 2018-01-15 DIAGNOSIS — N72 Inflammatory disease of cervix uteri: Secondary | ICD-10-CM | POA: Diagnosis not present

## 2018-01-15 DIAGNOSIS — N800 Endometriosis of uterus: Secondary | ICD-10-CM | POA: Diagnosis not present

## 2018-01-15 DIAGNOSIS — D259 Leiomyoma of uterus, unspecified: Secondary | ICD-10-CM | POA: Diagnosis not present

## 2018-01-15 DIAGNOSIS — I10 Essential (primary) hypertension: Secondary | ICD-10-CM | POA: Diagnosis not present

## 2018-01-16 DIAGNOSIS — N800 Endometriosis of uterus: Secondary | ICD-10-CM | POA: Diagnosis not present

## 2018-01-16 DIAGNOSIS — N813 Complete uterovaginal prolapse: Secondary | ICD-10-CM | POA: Diagnosis not present

## 2018-01-16 DIAGNOSIS — N72 Inflammatory disease of cervix uteri: Secondary | ICD-10-CM | POA: Diagnosis not present

## 2018-01-16 DIAGNOSIS — Z79899 Other long term (current) drug therapy: Secondary | ICD-10-CM | POA: Diagnosis not present

## 2018-01-16 DIAGNOSIS — K219 Gastro-esophageal reflux disease without esophagitis: Secondary | ICD-10-CM | POA: Diagnosis not present

## 2018-01-16 DIAGNOSIS — D259 Leiomyoma of uterus, unspecified: Secondary | ICD-10-CM | POA: Diagnosis not present

## 2018-01-16 DIAGNOSIS — I10 Essential (primary) hypertension: Secondary | ICD-10-CM | POA: Diagnosis not present

## 2018-01-16 MED FILL — oxyCODONE HCL 5 MG TABS: 5 | 3 days supply | Qty: 20 | Fill #0

## 2018-02-05 MED FILL — HYDROCHLOROTHIAZIDE 25 MG T: 25 | 90 days supply | Qty: 90 | Fill #3

## 2018-02-05 MED FILL — METOPROLOL TARTRATE 50 MG T: 50 | 90 days supply | Qty: 180 | Fill #1

## 2018-02-25 DIAGNOSIS — Z9889 Other specified postprocedural states: Secondary | ICD-10-CM | POA: Diagnosis not present

## 2018-02-25 DIAGNOSIS — R200 Anesthesia of skin: Secondary | ICD-10-CM | POA: Diagnosis not present

## 2018-02-25 DIAGNOSIS — N898 Other specified noninflammatory disorders of vagina: Secondary | ICD-10-CM | POA: Diagnosis not present

## 2018-04-28 ENCOUNTER — Encounter: Payer: Self-pay | Admitting: Gastroenterology

## 2018-05-05 ENCOUNTER — Other Ambulatory Visit (INDEPENDENT_AMBULATORY_CARE_PROVIDER_SITE_OTHER): Payer: 59

## 2018-05-05 ENCOUNTER — Ambulatory Visit: Payer: 59 | Admitting: Nurse Practitioner

## 2018-05-05 ENCOUNTER — Encounter: Payer: Self-pay | Admitting: Nurse Practitioner

## 2018-05-05 ENCOUNTER — Other Ambulatory Visit: Payer: Self-pay | Admitting: Nurse Practitioner

## 2018-05-05 VITALS — BP 118/80 | HR 62 | Temp 98.0°F | Resp 16 | Ht 63.0 in | Wt 168.0 lb

## 2018-05-05 DIAGNOSIS — Z9189 Other specified personal risk factors, not elsewhere classified: Secondary | ICD-10-CM

## 2018-05-05 DIAGNOSIS — R7303 Prediabetes: Secondary | ICD-10-CM

## 2018-05-05 DIAGNOSIS — Z1159 Encounter for screening for other viral diseases: Secondary | ICD-10-CM

## 2018-05-05 DIAGNOSIS — I10 Essential (primary) hypertension: Secondary | ICD-10-CM | POA: Diagnosis not present

## 2018-05-05 DIAGNOSIS — Z13220 Encounter for screening for lipoid disorders: Secondary | ICD-10-CM | POA: Diagnosis not present

## 2018-05-05 DIAGNOSIS — R7309 Other abnormal glucose: Secondary | ICD-10-CM

## 2018-05-05 DIAGNOSIS — Z23 Encounter for immunization: Secondary | ICD-10-CM | POA: Diagnosis not present

## 2018-05-05 DIAGNOSIS — Z0000 Encounter for general adult medical examination without abnormal findings: Secondary | ICD-10-CM

## 2018-05-05 DIAGNOSIS — E119 Type 2 diabetes mellitus without complications: Secondary | ICD-10-CM | POA: Insufficient documentation

## 2018-05-05 DIAGNOSIS — E785 Hyperlipidemia, unspecified: Secondary | ICD-10-CM

## 2018-05-05 LAB — LIPID PANEL
CHOL/HDL RATIO: 6
Cholesterol: 252 mg/dL — ABNORMAL HIGH (ref 0–200)
HDL: 42 mg/dL (ref >39.00)
LDL CALC: 184 mg/dL — AB (ref 0–99)
NONHDL: 209.85
Triglycerides: 127 mg/dL (ref 0.0–149.0)
VLDL: 25.4 mg/dL (ref 0.0–40.0)

## 2018-05-05 LAB — HEPATITIS C ANTIBODY
HEP C AB: NONREACTIVE
SIGNAL TO CUT-OFF: 0.03 (ref <1.00)

## 2018-05-05 LAB — COMPREHENSIVE METABOLIC PANEL
ALT: 27 U/L (ref 0–35)
AST: 23 U/L (ref 0–37)
Albumin: 3.9 g/dL (ref 3.5–5.2)
Alkaline Phosphatase: 72 U/L (ref 39–117)
BUN: 18 mg/dL (ref 6–23)
CALCIUM: 9.9 mg/dL (ref 8.4–10.5)
CHLORIDE: 101 meq/L (ref 96–112)
CO2: 32 meq/L (ref 19–32)
CREATININE: 0.71 mg/dL (ref 0.40–1.20)
GFR: 106.55 mL/min (ref >60.00)
Glucose, Bld: 120 mg/dL — ABNORMAL HIGH (ref 70–99)
Potassium: 3.7 mEq/L (ref 3.5–5.1)
Sodium: 141 mEq/L (ref 135–145)
Total Bilirubin: 0.5 mg/dL (ref 0.2–1.2)
Total Protein: 7.5 g/dL (ref 6.0–8.3)

## 2018-05-05 LAB — CBC
HEMATOCRIT: 35.8 % — AB (ref 36.0–46.0)
Hemoglobin: 12.1 g/dL (ref 12.0–15.0)
MCHC: 33.7 g/dL (ref 30.0–36.0)
MCV: 90.4 fl (ref 78.0–100.0)
PLATELETS: 295 10*3/uL (ref 150.0–400.0)
RBC: 3.96 Mil/uL (ref 3.87–5.11)
RDW: 13.1 % (ref 11.5–15.5)
WBC: 9.2 10*3/uL (ref 4.0–10.5)

## 2018-05-05 LAB — HEMOGLOBIN A1C: HEMOGLOBIN A1C: 6.4 % (ref 4.6–6.5)

## 2018-05-05 LAB — TSH: TSH: 2.33 u[IU]/mL (ref 0.35–4.50)

## 2018-05-05 MED ORDER — HYDROCHLOROTHIAZIDE 25 MG PO TABS: 25.0000 mg | ORAL_TABLET | Freq: Every day | ORAL | 4 refills | Status: DC

## 2018-05-05 MED FILL — HYDROCHLOROTHIAZIDE 25 MG T: 25 | 90 days supply | Qty: 90 | Fill #0

## 2018-05-05 NOTE — Progress Notes (Signed)
Name: Yesenia Larson   MRN: [id number]    DOB: 1956-01-06   Date:05/05/2018       Progress Note  Subjective  Chief Complaint  Chief Complaint  Patient presents with  . Establish Care    CPE, fasting    HPI Yesenia Larson is here today to establish care with me. She is routinely following with urology and gynecology for routine women's health care and recent uterovaginal prolapse with hysterectomy. Patient presents for annual CPE   Diet and exercise: Due to recent surgery, no routine exercise but thinking about increasing exercise. Does use her FitBit and walks 10,000 steps per day. Does not watch diet but has started cutting back on sweet tea and does not drink sodas  USPSTF grade A and B recommendations  Depression:  Depression screen [facility] 2/9 05/05/2018  Decreased Interest 0  Down, Depressed, Hopeless 0  PHQ - 2 Score 0  Altered sleeping 0  Tired, decreased energy 0  Change in appetite 0  Feeling bad or failure about yourself  0  Trouble concentrating 0  Moving slowly or fidgety/restless 0  Suicidal thoughts 0  PHQ-9 Score 0   Hypertension: maintained on HCTZ 25, metoprolol 50 daily Taking both medications with no noted adverse effects Recent home readings 117/70 BP Readings from Last 3 Encounters:  05/05/18 118/80  01/05/18 118/80  04/16/17 128/80   Obesity: Wt Readings from Last 3 Encounters:  05/05/18 168 lb (76.2 kg)  01/05/18 166 lb (75.3 kg)  04/16/17 163 lb (73.9 kg)   BMI Readings from Last 3 Encounters:  05/05/18 29.76 kg/m  01/05/18 29.41 kg/m  04/16/17 28.87 kg/m    Alcohol: no  Tobacco use: no, never  HIV, hep C: HIV screening done in past by GYN per patient report, will order Hep C screening today STD testing and prevention (chl/gon/syphilis): declines screening today, no concerns  Intimate partner violence:denies, feels safe  Vaccinations: TDAP given today  Advanced Care Planning: A voluntary discussion about advance care planning including  the explanation and discussion of advance directives.  Discussed health care proxy and Living will, and the patient DOES NOT have a living will at present time. If patient does have living will, I have requested they bring this to the clinic to be scanned in to their chart.  Breast cancer: mammogram up to date by GYN provider Cervical cancer screening: PAP up to date by GYN provider   Lipids: elevated in past, but not maintained on any medications currently Will update lipid panel today Lab Results  Component Value Date   CHOL 260 (H) 01/13/2015   Lab Results  Component Value Date   HDL 43.20 01/13/2015   Lab Results  Component Value Date   LDLCALC 196 (H) 01/13/2015   Lab Results  Component Value Date   TRIG 104.0 01/13/2015   Lab Results  Component Value Date   CHOLHDL 6 01/13/2015   No results found for: LDLDIRECT  Glucose:  Glucose, Bld  Date Value Ref Range Status  05/24/2016 95 70 - 99 mg/dL Final  01/13/2015 96 70 - 99 mg/dL Final    Skin cancer: no concerning lesions or moles Colorectal cancer: colonoscopy up to date  Aspirin: not indicated  ECG:not indicated    Patient Active Problem List   Diagnosis Date Noted  . Biceps tendinitis of both shoulders 01/16/2017  . Acute upper respiratory infection 03/16/2015  . Essential hypertension 01/13/2015  . Lightheadedness 12/28/2014  . Pelvic relaxation   . Lichen sclerosus   .  H/O vitamin D deficiency   . Osteopenia   . Fibroids   . Anemia   . Irregular menses   . Hx of amenorrhea   . H/O gonorrhea   . Mild anemia   . 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 ABDOMINAL HYSTERECTOMY    . DILATION AND EVACUATION  1994  . TONSILLECTOMY    . VULVA /PERINEUM BIOPSY  02/2008    Family History  Problem Relation Age of Onset  . Colon polyps Sister   . Diabetes Sister   . Diabetes Brother   . Diabetes Sister     Social History   Socioeconomic History  .  Marital status: Married    Spouse name: Not on file  . Number of children: Not on file  . Years of education: Not on file  . Highest education level: Not on file  Occupational History  . Not on file  Social Needs  . Financial resource strain: Not on file  . Food insecurity:    Worry: Not on file    Inability: Not on file  . Transportation needs:    Medical: Not on file    Non-medical: Not on file  Tobacco Use  . Smoking status: Never Smoker  . Smokeless tobacco: Never Used  Substance and Sexual Activity  . Alcohol use: No  . Drug use: No  . Sexual activity: Yes    Birth control/protection: Post-menopausal  Lifestyle  . Physical activity:    Days per week: Not on file    Minutes per session: Not on file  . Stress: Not on file  Relationships  . Social connections:    Talks on phone: Not on file    Gets together: Not on file    Attends religious service: Not on file    Active member of club or organization: Not on file    Attends meetings of clubs or organizations: Not on file    Relationship status: Not on file  . Intimate partner violence:    Fear of current or ex partner: Not on file    Emotionally abused: Not on file    Physically abused: Not on file    Forced sexual activity: Not on file  Other Topics Concern  . Not on file  Social History Narrative  . Not on file     Current Outpatient Medications:  .  calcium carbonate 200 MG capsule, Take 250 mg by mouth 2 (two) times daily with a meal., Disp: , Rfl:  .  Diclofenac Sodium (PENNSAID) 2 % SOLN, Place 2 application onto the skin 2 (two) times daily., Disp: 112 g, Rfl: 3 .  fish oil-omega-3 fatty acids 1000 MG capsule, Take 2 g by mouth daily., Disp: , Rfl:  .  hydrochlorothiazide (HYDRODIURIL) 25 MG tablet, Take 1 tablet (25 mg total) by mouth daily., Disp: 90 tablet, Rfl: 3 .  metoprolol tartrate (LOPRESSOR) 50 MG tablet, Take 1 tablet (50 mg total) by mouth daily., Disp: 180 tablet, Rfl: 3 .  Multiple Vitamin  (MULTIVITAMIN) tablet, Take 1 tablet by mouth daily., Disp: , Rfl:   Allergies  Allergen Reactions  . Shrimp [Shellfish Allergy] Swelling     ROS  Constitutional: Negative for fever or weight change.  Respiratory: Negative for cough and shortness of breath.   Cardiovascular: Negative for chest pain or palpitations.  Gastrointestinal: Negative for abdominal pain, no bowel changes.  Musculoskeletal: Negative for gait problem or joint swelling.  Skin: Negative for rash.  Neurological: Negative for dizziness or headache.  No other specific complaints in a complete review of systems (except as listed in HPI above).   Objective  Vitals:   05/05/18 0928  BP: 118/80  Pulse: 62  Resp: 16  Temp: 98 F (36.7 C)  TempSrc: Oral  SpO2: 98%  Weight: 168 lb (76.2 kg)  Height: 5\' 3"  (1.6 m)    Body mass index is 29.76 kg/m.  Physical Exam Vital signs reviewed. Constitutional: Patient appears well-developed and well-nourished. No distress.  HENT: Head: Normocephalic and atraumatic. Ears: B TMs ok, no erythema or effusion; Nose: Nose normal. Mouth/Throat: Oropharynx is clear and moist. No oropharyngeal exudate.  Eyes: Conjunctivae and EOM are normal. Pupils are equal, round, and reactive to light. No scleral icterus.  Neck: Normal range of motion. Neck supple. No cervical adenopathy. No thyromegaly present.  Cardiovascular: Normal rate, regular rhythm and normal heart sounds.  No murmur heard. No BLE edema. Distal pulses intact Pulmonary/Chest: Effort normal and breath sounds normal. No respiratory distress. Abdominal: Soft. Bowel sounds are normal, no distension. There is no tenderness. no masses Breast: defd to GYN FEMALE GENITALIA:  Defd to GYN Musculoskeletal: Normal range of motion, no joint effusions. No gross deformities Neurological: She is alert and oriented to person, place, and time. No cranial nerve deficit. Coordination, balance, strength, speech and gait are normal.   Skin: Skin is warm and dry. No rash noted. No erythema.  Psychiatric: Patient has a normal mood and affect. behavior is normal. Judgment and thought content normal.   Assessment & Plan RTC in 1 year for CPE, or sooner if needed

## 2018-05-05 NOTE — Assessment & Plan Note (Addendum)
Stable Continue current medications Update labs - TSH; Future - CBC; Future - Comprehensive metabolic panel; Future - hydrochlorothiazide (HYDRODIURIL) 25 MG tablet; Take 1 tablet (25 mg total) by mouth daily.  Dispense: 90 tablet; Refill: 4

## 2018-05-05 NOTE — Assessment & Plan Note (Signed)
-  USPSTF grade A and B recommendations reviewed with patient; age-appropriate recommendations, preventive care, screening tests, etc discussed and encouraged; healthy living encouraged; see AVS for patient education given to patient. Declines advanced directives packet -Discussed importance of 150 minutes of physical activity weekly,  eat 6 servings of fruit/vegetables daily and drink plenty of water and avoid sweet beverages.  -Follow up and care instructions discussed and provided in AVS.  -Reviewed Health Maintenance:  -Need for Tdap vaccination- Tdap vaccine greater than or equal to 7yo IM -Encounter for hepatitis C virus screening test for high risk patient- Hepatitis C antibody; Future -have requested records from GYN provider to update HM- mammogram, HIV screening  Screening for cholesterol level Fasting today - Lipid panel; Future  Elevated hemoglobin A1c Noted on past lab work - Hemoglobin A1c; Future

## 2018-05-05 NOTE — Patient Instructions (Addendum)
Please head downstairs for lab work/x-rays. If any of your test results are critically abnormal, you will be contacted right away. Your results may be released to your MyChart for viewing before I am able to provide you with my response. I will contact you within a week about your test results and any recommendations for abnormalities.  I will plan to see you back in 1 year, or sooner if needed!  It was nice to meet you. Thanks for letting me take care of you today.  Health Maintenance, Female Adopting a healthy lifestyle and getting preventive care can go a long way to promote health and wellness. Talk with your health care provider about what schedule of regular examinations is right for you. This is a good chance for you to check in with your provider about disease prevention and staying healthy. In between checkups, there are plenty of things you can do on your own. Experts have done a lot of research about which lifestyle changes and preventive measures are most likely to keep you healthy. Ask your health care provider for more information. Weight and diet Eat a healthy diet  Be sure to include plenty of vegetables, fruits, low-fat dairy products, and lean protein.  Do not eat a lot of foods high in solid fats, added sugars, or salt.  Get regular exercise. This is one of the most important things you can do for your health. ? Most adults should exercise for at least 150 minutes each week. The exercise should increase your heart rate and make you sweat (moderate-intensity exercise). ? Most adults should also do strengthening exercises at least twice a week. This is in addition to the moderate-intensity exercise.  Maintain a healthy weight  Body mass index (BMI) is a measurement that can be used to identify possible weight problems. It estimates body fat based on height and weight. Your health care provider can help determine your BMI and help you achieve or maintain a healthy  weight.  For females 43 years of age and older: ? A BMI below 18.5 is considered underweight. ? A BMI of 18.5 to 24.9 is normal. ? A BMI of 25 to 29.9 is considered overweight. ? A BMI of 30 and above is considered obese.  Watch levels of cholesterol and blood lipids  You should start having your blood tested for lipids and cholesterol at 64 years of age, then have this test every 5 years.  You may need to have your cholesterol levels checked more often if: ? Your lipid or cholesterol levels are high. ? You are older than 64 years of age. ? You are at high risk for heart disease.  Cancer screening Lung Cancer  Lung cancer screening is recommended for adults 65-30 years old who are at high risk for lung cancer because of a history of smoking.  A yearly low-dose CT scan of the lungs is recommended for people who: ? Currently smoke. ? Have quit within the past 15 years. ? Have at least a 30-pack-year history of smoking. A pack year is smoking an average of one pack of cigarettes a day for 1 year.  Yearly screening should continue until it has been 15 years since you quit.  Yearly screening should stop if you develop a health problem that would prevent you from having lung cancer treatment.  Breast Cancer  Practice breast self-awareness. This means understanding how your breasts normally appear and feel.  It also means doing regular breast self-exams. Let your health  care provider know about any changes, no matter how small.  If you are in your 20s or 30s, you should have a clinical breast exam (CBE) by a health care provider every 1-3 years as part of a regular health exam.  If you are 40 or older, have a CBE every year. Also consider having a breast X-ray (mammogram) every year.  If you have a family history of breast cancer, talk to your health care provider about genetic screening.  If you are at high risk for breast cancer, talk to your health care provider about having an  MRI and a mammogram every year.  Breast cancer gene (BRCA) assessment is recommended for women who have family members with BRCA-related cancers. BRCA-related cancers include: ? Breast. ? Ovarian. ? Tubal. ? Peritoneal cancers.  Results of the assessment will determine the need for genetic counseling and BRCA1 and BRCA2 testing.  Cervical Cancer Your health care provider may recommend that you be screened regularly for cancer of the pelvic organs (ovaries, uterus, and vagina). This screening involves a pelvic examination, including checking for microscopic changes to the surface of your cervix (Pap test). You may be encouraged to have this screening done every 3 years, beginning at age 21.  For women ages 30-65, health care providers may recommend pelvic exams and Pap testing every 3 years, or they may recommend the Pap and pelvic exam, combined with testing for human papilloma virus (HPV), every 5 years. Some types of HPV increase your risk of cervical cancer. Testing for HPV may also be done on women of any age with unclear Pap test results.  Other health care providers may not recommend any screening for nonpregnant women who are considered low risk for pelvic cancer and who do not have symptoms. Ask your health care provider if a screening pelvic exam is right for you.  If you have had past treatment for cervical cancer or a condition that could lead to cancer, you need Pap tests and screening for cancer for at least 20 years after your treatment. If Pap tests have been discontinued, your risk factors (such as having a new sexual partner) need to be reassessed to determine if screening should resume. Some women have medical problems that increase the chance of getting cervical cancer. In these cases, your health care provider may recommend more frequent screening and Pap tests.  Colorectal Cancer  This type of cancer can be detected and often prevented.  Routine colorectal cancer screening  usually begins at 64 years of age and continues through 64 years of age.  Your health care provider may recommend screening at an earlier age if you have risk factors for colon cancer.  Your health care provider may also recommend using home test kits to check for hidden blood in the stool.  A small camera at the end of a tube can be used to examine your colon directly (sigmoidoscopy or colonoscopy). This is done to check for the earliest forms of colorectal cancer.  Routine screening usually begins at age 50.  Direct examination of the colon should be repeated every 5-10 years through 64 years of age. However, you may need to be screened more often if early forms of precancerous polyps or small growths are found.  Skin Cancer  Check your skin from head to toe regularly.  Tell your health care provider about any new moles or changes in moles, especially if there is a change in a mole's shape or color.  Also tell   your health care provider if you have a mole that is larger than the size of a pencil eraser.  Always use sunscreen. Apply sunscreen liberally and repeatedly throughout the day.  Protect yourself by wearing long sleeves, pants, a wide-brimmed hat, and sunglasses whenever you are outside.  Heart disease, diabetes, and high blood pressure  High blood pressure causes heart disease and increases the risk of stroke. High blood pressure is more likely to develop in: ? People who have blood pressure in the high end of the normal range (130-139/85-89 mm Hg). ? People who are overweight or obese. ? People who are African American.  If you are 52-30 years of age, have your blood pressure checked every 3-5 years. If you are 28 years of age or older, have your blood pressure checked every year. You should have your blood pressure measured twice-once when you are at a hospital or clinic, and once when you are not at a hospital or clinic. Record the average of the two measurements. To check  your blood pressure when you are not at a hospital or clinic, you can use: ? An automated blood pressure machine at a pharmacy. ? A home blood pressure monitor.  If you are between 104 years and 18 years old, ask your health care provider if you should take aspirin to prevent strokes.  Have regular diabetes screenings. This involves taking a blood sample to check your fasting blood sugar level. ? If you are at a normal weight and have a low risk for diabetes, have this test once every three years after 64 years of age. ? If you are overweight and have a high risk for diabetes, consider being tested at a younger age or more often. Preventing infection Hepatitis B  If you have a higher risk for hepatitis B, you should be screened for this virus. You are considered at high risk for hepatitis B if: ? You were born in a country where hepatitis B is common. Ask your health care provider which countries are considered high risk. ? Your parents were born in a high-risk country, and you have not been immunized against hepatitis B (hepatitis B vaccine). ? You have HIV or AIDS. ? You use needles to inject street drugs. ? You live with someone who has hepatitis B. ? You have had sex with someone who has hepatitis B. ? You get hemodialysis treatment. ? You take certain medicines for conditions, including cancer, organ transplantation, and autoimmune conditions.  Hepatitis C  Blood testing is recommended for: ? Everyone born from [birth year] through 1967. ? Anyone with known risk factors for hepatitis C.  Sexually transmitted infections (STIs)  You should be screened for sexually transmitted infections (STIs) including gonorrhea and chlamydia if: ? You are sexually active and are younger than 64 years of age. ? You are older than 64 years of age and your health care provider tells you that you are at risk for this type of infection. ? Your sexual activity has changed since you were last screened and you  are at an increased risk for chlamydia or gonorrhea. Ask your health care provider if you are at risk.  If you do not have HIV, but are at risk, it may be recommended that you take a prescription medicine daily to prevent HIV infection. This is called pre-exposure prophylaxis (PrEP). You are considered at risk if: ? You are sexually active and do not regularly use condoms or know the HIV status of your partner(s). ?  You take drugs by injection. ? You are sexually active with a partner who has HIV.  Talk with your health care provider about whether you are at high risk of being infected with HIV. If you choose to begin PrEP, you should first be tested for HIV. You should then be tested every 3 months for as long as you are taking PrEP. Pregnancy  If you are premenopausal and you may become pregnant, ask your health care provider about preconception counseling.  If you may become pregnant, take 400 to 800 micrograms (mcg) of folic acid every day.  If you want to prevent pregnancy, talk to your health care provider about birth control (contraception). Osteoporosis and menopause  Osteoporosis is a disease in which the bones lose minerals and strength with aging. This can result in serious bone fractures. Your risk for osteoporosis can be identified using a bone density scan.  If you are 65 years of age or older, or if you are at risk for osteoporosis and fractures, ask your health care provider if you should be screened.  Ask your health care provider whether you should take a calcium or vitamin D supplement to lower your risk for osteoporosis.  Menopause may have certain physical symptoms and risks.  Hormone replacement therapy may reduce some of these symptoms and risks. Talk to your health care provider about whether hormone replacement therapy is right for you. Follow these instructions at home:  Schedule regular health, dental, and eye exams.  Stay current with your  immunizations.  Do not use any tobacco products including cigarettes, chewing tobacco, or electronic cigarettes.  If you are pregnant, do not drink alcohol.  If you are breastfeeding, limit how much and how often you drink alcohol.  Limit alcohol intake to no more than 1 drink per day for nonpregnant women. One drink equals 12 ounces of beer, 5 ounces of wine, or 1 ounces of hard liquor.  Do not use street drugs.  Do not share needles.  Ask your health care provider for help if you need support or information about quitting drugs.  Tell your health care provider if you often feel depressed.  Tell your health care provider if you have ever been abused or do not feel safe at home. This information is not intended to replace advice given to you by your health care provider. Make sure you discuss any questions you have with your health care provider. Document Released: 06/10/2011 Document Revised: 05/02/2016 Document Reviewed: 08/29/2015 [employer] Interactive Patient Education  2018 [employer]  

## 2018-05-13 ENCOUNTER — Encounter: Payer: Self-pay | Admitting: Nurse Practitioner

## 2018-06-01 ENCOUNTER — Encounter: Payer: Self-pay | Admitting: Nurse Practitioner

## 2018-06-01 ENCOUNTER — Ambulatory Visit: Payer: 59 | Admitting: Nurse Practitioner

## 2018-06-01 VITALS — BP 134/78 | HR 60 | Temp 98.1°F | Resp 16 | Ht 63.0 in | Wt 170.0 lb

## 2018-06-01 DIAGNOSIS — L237 Allergic contact dermatitis due to plants, except food: Secondary | ICD-10-CM

## 2018-06-01 MED ORDER — METHYLPREDNISOLONE ACETATE 40 MG/ML IJ SUSP
40.0000 mg | Freq: Once | INTRAMUSCULAR | Status: AC
  Administered 2018-06-01: 40 mg via INTRAMUSCULAR

## 2018-06-01 MED ORDER — METHYLPREDNISOLONE 4 MG PO TABS: 4.0000 mg | ORAL_TABLET | Freq: Every day | ORAL | 0 refills | Status: DC

## 2018-06-01 MED FILL — METHYLPREDNISOLONE 4 MG TAB: 4 | 6 days supply | Qty: 21 | Fill #0

## 2018-06-01 NOTE — Patient Instructions (Addendum)
Please take medrol dose pack as prescribed, prescription sent to pharmacy.   Poison Ivy Dermatitis Poison ivy dermatitis is redness and soreness (inflammation) of the skin. It is caused by a chemical that is found on the leaves of the poison ivy plant. You may also have itching, a rash, and blisters. Symptoms often clear up in 1-2 weeks. You may get this condition by touching a poison ivy plant. You can also get it by touching something that has the chemical on it. This may include animals or objects that have come in contact with the plant. Follow these instructions at home: General instructions  Take or apply over-the-counter and prescription medicines only as told by your doctor.  If you touch poison ivy, wash your skin with soap and cold water right away.  Use hydrocortisone creams or calamine lotion as needed to help with itching.  Take oatmeal baths as needed. Use colloidal oatmeal. You can get this at a pharmacy or grocery store. Follow the instructions on the package.  Do not scratch or rub your skin.  While you have the rash, wash your clothes right after you wear them. Prevention  Know what poison ivy looks like so you can avoid it. This plant has three leaves with flowering branches on a single stem. The leaves are glossy. They have uneven edges that come to a point at the front.  If you have touched poison ivy, wash with soap and water right away. Be sure to wash under your fingernails.  When hiking or camping, wear long pants, a long-sleeved shirt, tall socks, and hiking boots. You can also use a lotion on your skin that helps to prevent contact with the chemical on the plant.  If you think that your clothes or outdoor gear came in contact with poison ivy, rinse them off with a garden hose before you bring them inside your house. Contact a doctor if:  You have open sores in the rash area.  You have more redness, swelling, or pain in the affected area.  You have redness  that spreads beyond the rash area.  You have fluid, blood, or pus coming from the affected area.  You have a fever.  You have a rash over a large area of your body.  You have a rash on your eyes, mouth, or genitals.  Your rash does not get better after a few days. Get help right away if:  Your face swells or your eyes swell shut.  You have trouble breathing.  You have trouble swallowing. This information is not intended to replace advice given to you by your health care provider. Make sure you discuss any questions you have with your health care provider. Document Released: 12/28/2010 Document Revised: 05/02/2016 Document Reviewed: 05/03/2015 Elsevier Interactive Patient Education  [employer].

## 2018-06-01 NOTE — Progress Notes (Signed)
Name: Yesenia Larson   MRN: [id number]    DOB: Apr 26, 1956   Date:06/01/2018       Progress Note  Subjective  Chief Complaint  Chief Complaint  Patient presents with  . Rash    Rash on chest that started last week, think it is poison oak    HPI  Yesenia Larson is here today for evaluation of acute complaint of rash. She first noticed the rash after working in the yard last week, recalls scratching her chest during yardwork and the next day she noticed a rash to the middle of her chest. The rash is itchy and irritated She denies fevers, drainage, bleeding, spreading redness. The rash is no where else on her body, no one else in house with rash Overall she feels well. She has been applying cortisone, calamine with minimal relief  Patient Active Problem List   Diagnosis Date Noted  . Routine general medical examination at a health care facility 05/05/2018  . Pre-diabetes 05/05/2018  . Biceps tendinitis of both shoulders 01/16/2017  . Acute upper respiratory infection 03/16/2015  . Essential hypertension 01/13/2015  . Lichen sclerosus   . H/O vitamin D deficiency   . Osteopenia   . Fibroids   . Anemia   . Irregular menses   . Hx of amenorrhea   . H/O gonorrhea   . Mild anemia   . DEQUERVAIN'S 12/05/2009    Social History   Tobacco Use  . Smoking status: Never Smoker  . Smokeless tobacco: Never Used  Substance Use Topics  . Alcohol use: No     Current Outpatient Medications:  .  calcium carbonate 200 MG capsule, Take 250 mg by mouth 2 (two) times daily with a meal., Disp: , Rfl:  .  Diclofenac Sodium (PENNSAID) 2 % SOLN, Place 2 application onto the skin 2 (two) times daily., Disp: 112 g, Rfl: 3 .  fish oil-omega-3 fatty acids 1000 MG capsule, Take 2 g by mouth daily., Disp: , Rfl:  .  hydrochlorothiazide (HYDRODIURIL) 25 MG tablet, Take 1 tablet (25 mg total) by mouth daily., Disp: 90 tablet, Rfl: 4 .  metoprolol tartrate (LOPRESSOR) 50 MG tablet, Take 1 tablet (50 mg  total) by mouth daily., Disp: 180 tablet, Rfl: 3 .  Multiple Vitamin (MULTIVITAMIN) tablet, Take 1 tablet by mouth daily., Disp: , Rfl:   Allergies  Allergen Reactions  . Shrimp [Shellfish Allergy] Swelling    ROS    No other specific complaints in a complete review of systems (except as listed in HPI above).  Objective  Vitals:   06/01/18 0957  BP: 134/78  Pulse: 60  Resp: 16  Temp: 98.1 F (36.7 C)  TempSrc: Oral  SpO2: 97%  Weight: 169 lb 15.7 oz (77.1 kg)  Height: 5\' 3"  (1.6 m)   Body mass index is 30.11 kg/m.  Nursing Note and Vital Signs reviewed.  Physical Exam  Constitutional: Patient appears well-developed and well-nourished.  No distress.  HEENT: head atraumatic, normocephalic, pupils equal and reactive to light, EOM's intact, neck supple, oropharynx pink and moist without exudate Cardiovascular: Normal rate, regular rhythm, distal pulses intact. Pulmonary/Chest: Effort normal and breath sounds clear. No respiratory distress or retractions. Neurological: She is alert and oriented to person, place, and time. No cranial nerve deficit. Coordination, balance, strength, speech and gait are normal.  Skin: Skin is warm and dry. Erythematous rash to anterior chest with linear streak-like configurations. Psychiatric: Patient has a normal mood and affect. behavior is normal. Judgment  and thought content normal.  Assessment & Plan F/U for new,  1. Poison ivy dermatitis Suspect poison ivy dermatitis based on H & PE  Steroid injection given in office and dose-pack prescribed today-dosing and side effects discussed  -home management, Red flags, and when to present for emergency care or RTC including fever >101.26F, chest pain, shortness of breath, new/worsening/un-resolving symptoms, reviewed with patient at time of visit. Follow up and care instructions discussed and provided in AVS. - methylPREDNISolone (MEDROL) 4 MG tablet; Take 1 tablet (4 mg total) by mouth daily. 6  tabs today, 5 tabs day 2, 4 tabs day 3, 3 tabs day 4, 2 tabs day 5, 1 tab day 6. Take w/ food.  Dispense: 21 tablet; Refill: 0 - methylPREDNISolone acetate (DEPO-MEDROL) injection 40 mg

## 2018-07-30 ENCOUNTER — Ambulatory Visit: Payer: Self-pay | Admitting: Nurse Practitioner

## 2018-08-12 MED FILL — HYDROCHLOROTHIAZIDE 25 MG T: 25 | 90 days supply | Qty: 90 | Fill #1

## 2018-08-13 ENCOUNTER — Other Ambulatory Visit: Payer: Self-pay | Admitting: *Deleted

## 2018-08-13 DIAGNOSIS — I10 Essential (primary) hypertension: Secondary | ICD-10-CM

## 2018-08-13 MED ORDER — METOPROLOL TARTRATE 50 MG PO TABS: 50.0000 mg | ORAL_TABLET | Freq: Every day | ORAL | 2 refills | Status: DC

## 2018-08-13 MED FILL — METOPROLOL TARTRATE 50 MG T: 50 | 90 days supply | Qty: 90 | Fill #0

## 2018-08-19 ENCOUNTER — Ambulatory Visit: Payer: 59 | Admitting: Nurse Practitioner

## 2018-08-19 ENCOUNTER — Other Ambulatory Visit (INDEPENDENT_AMBULATORY_CARE_PROVIDER_SITE_OTHER): Payer: 59

## 2018-08-19 ENCOUNTER — Encounter: Payer: Self-pay | Admitting: Nurse Practitioner

## 2018-08-19 VITALS — BP 126/80 | HR 63 | Ht 63.0 in | Wt 167.0 lb

## 2018-08-19 DIAGNOSIS — E785 Hyperlipidemia, unspecified: Secondary | ICD-10-CM | POA: Insufficient documentation

## 2018-08-19 DIAGNOSIS — Z23 Encounter for immunization: Secondary | ICD-10-CM | POA: Diagnosis not present

## 2018-08-19 DIAGNOSIS — R7303 Prediabetes: Secondary | ICD-10-CM | POA: Diagnosis not present

## 2018-08-19 DIAGNOSIS — I10 Essential (primary) hypertension: Secondary | ICD-10-CM | POA: Diagnosis not present

## 2018-08-19 LAB — HEMOGLOBIN A1C: HEMOGLOBIN A1C: 6.5 % (ref 4.6–6.5)

## 2018-08-19 LAB — LIPID PANEL
CHOLESTEROL: 252 mg/dL — AB (ref 0–200)
HDL: 43.7 mg/dL (ref >39.00)
LDL Cholesterol: 189 mg/dL — ABNORMAL HIGH (ref 0–99)
NonHDL: 208.64
TRIGLYCERIDES: 96 mg/dL (ref 0.0–149.0)
Total CHOL/HDL Ratio: 6
VLDL: 19.2 mg/dL (ref 0.0–40.0)

## 2018-08-19 MED ORDER — HYDROCHLOROTHIAZIDE 25 MG PO TABS: 25.0000 mg | ORAL_TABLET | Freq: Every day | ORAL | 2 refills | Status: DC

## 2018-08-19 MED ORDER — METOPROLOL TARTRATE 50 MG PO TABS: 50.0000 mg | ORAL_TABLET | Freq: Every day | ORAL | 2 refills | Status: DC

## 2018-08-19 NOTE — Assessment & Plan Note (Signed)
We discussed ASCVD risk calculator and her higher risk for MI, CVA due to having HTN, HLD and other factors Update lipid panel today-She is fasting F/U with further recommendations pending lab results She is agreeable to nutrition referral, additional education provided on AVS - Lipid panel; Future - Ambulatory referral to Nutrition and Diabetic Education

## 2018-08-19 NOTE — Assessment & Plan Note (Signed)
Stable Continue current medications - Ambulatory referral to Nutrition and Diabetic Education - hydrochlorothiazide (HYDRODIURIL) 25 MG tablet; Take 1 tablet (25 mg total) by mouth daily.  Dispense: 90 tablet; Refill: 2 - metoprolol tartrate (LOPRESSOR) 50 MG tablet; Take 1 tablet (50 mg total) by mouth daily.  Dispense: 90 tablet; Refill: 2

## 2018-08-19 NOTE — Patient Instructions (Addendum)
Please head downstairs for lab work. If any of your test results are critically abnormal, you will be contacted right away. Otherwise, I will contact you within a week about your test results and follow up recommendations  I have placed a referral to nutrition for dietary education Our office will begin processing this referral. Please follow up if you have not heard anything about this referral within 10 days.   If labs are normal, I will plan to see you back next May for your annual physical.   Cholesterol Cholesterol is a white, waxy, fat-like substance that is needed by the human body in small amounts. The liver makes all the cholesterol we need. Cholesterol is carried from the liver by the blood through the blood vessels. Deposits of cholesterol (plaques) may build up on blood vessel (artery) walls. Plaques make the arteries narrower and stiffer. Cholesterol plaques increase the risk for heart attack and stroke. You cannot feel your cholesterol level even if it is very high. The only way to know that it is high is to have a blood test. Once you know your cholesterol levels, you should keep a record of the test results. Work with your health care provider to keep your levels in the desired range. What do the results mean?  Total cholesterol is a rough measure of all the cholesterol in your blood.  LDL (low-density lipoprotein) is the "bad" cholesterol. This is the type that causes plaque to build up on the artery walls. You want this level to be low.  HDL (high-density lipoprotein) is the "good" cholesterol because it cleans the arteries and carries the LDL away. You want this level to be high.  Triglycerides are fat that the body can either burn for energy or store. High levels are closely linked to heart disease. What are the desired levels of cholesterol?  Total cholesterol below 200.  LDL below 100 for people who are at risk, below 70 for people at very high risk.  HDL above 40 is  good. A level of 60 or higher is considered to be protective against heart disease.  Triglycerides below 150. How can I lower my cholesterol? Diet Follow your diet program as told by your health care provider.  Choose fish or white meat chicken and [place], roasted or baked. Limit fatty cuts of red meat, fried foods, and processed meats, such as sausage and lunch meats.  Eat lots of fresh fruits and vegetables.  Choose whole grains, beans, pasta, potatoes, and cereals.  Choose olive oil, corn oil, or canola oil, and use only small amounts.  Avoid butter, mayonnaise, shortening, or palm kernel oils.  Avoid foods with trans fats.  Drink skim or nonfat milk and eat low-fat or nonfat yogurt and cheeses. Avoid whole milk, cream, ice cream, egg yolks, and full-fat cheeses.  Healthier desserts include angel food cake, ginger snaps, animal crackers, hard candy, popsicles, and low-fat or nonfat frozen yogurt. Avoid pastries, cakes, pies, and cookies.  Exercise  Follow your exercise program as told by your health care provider. A regular program: ? Helps to decrease LDL and raise HDL. ? Helps with weight control.  Do things that increase your activity level, such as gardening, walking, and taking the stairs.  Ask your health care provider about ways that you can be more active in your daily life.  Medicine  Take over-the-counter and prescription medicines only as told by your health care provider. ? Medicine may be prescribed by your health care provider to  help lower cholesterol and decrease the risk for heart disease. This is usually done if diet and exercise have failed to bring down cholesterol levels. ? If you have several risk factors, you may need medicine even if your levels are normal.  This information is not intended to replace advice given to you by your health care provider. Make sure you discuss any questions you have with your health care provider. Document Released:  08/20/2001 Document Revised: 06/22/2016 Document Reviewed: 05/25/2016 Elsevier Interactive Patient Education  [employer].

## 2018-08-19 NOTE — Assessment & Plan Note (Signed)
Update A1c F/U with further recommendations pending lab results - Hemoglobin A1c; Future - Ambulatory referral to Nutrition and Diabetic Education

## 2018-08-19 NOTE — Progress Notes (Signed)
Name: Yesenia Larson   MRN: [id number]    DOB: Apr 19, 1956   Date:08/19/2018       Progress Note  Subjective  Chief Complaint Follow up  HPI Yesenia Larson is here today for follow up of pre-diabetes and hyperlipidemia,  both noted on 04/15/18 CPE. She is also requesting refill of her BP meds- metoprolol tartrate 50 and HCTZ 25 daily, which she does take daily as prescribed. AFTER her abnormal labs on 04/15/18 CPE, A prescription for crestor was sent, but she declined to start medication and asked to work on diet and exercise prior to starting new medication. She returns today for follow up. she admits she has really not done well with changing her diet or exercise, finds it hard to exercise in the summer when it is so hot. She has resumed her fish oil supplement, but does not take regularly. Overall feels well today and does not have any complaints.  Lipid Panel     Component Value Date/Time   CHOL 252 (H) 05/05/2018 1004   TRIG 127.0 05/05/2018 1004   HDL 42.00 05/05/2018 1004   CHOLHDL 6 05/05/2018 1004   VLDL 25.4 05/05/2018 1004   LDLCALC 184 (H) 05/05/2018 1004   Lab Results  Component Value Date   HGBA1C 6.4 05/05/2018    BP Readings from Last 3 Encounters:  08/19/18 126/80  06/01/18 134/78  05/05/18 118/80   The 10-year ASCVD risk score Mikey Bussing DC Jr., et al., 2013) is: 11.1%   Values used to calculate the score:     Age: 62 years     Sex: Female     Is Non-Hispanic African American: Yes     Diabetic: No     Tobacco smoker: No     Systolic Blood Pressure: 387 mmHg     Is BP treated: Yes     HDL Cholesterol: 42 mg/dL     Total Cholesterol: 252 mg/dL   Patient Active Problem List   Diagnosis Date Noted  . Routine general medical examination at a health care facility 05/05/2018  . Pre-diabetes 05/05/2018  . Biceps tendinitis of both shoulders 01/16/2017  . Acute upper respiratory infection 03/16/2015  . Essential hypertension 01/13/2015  . Lichen sclerosus   . H/O vitamin  D deficiency   . Osteopenia   . Fibroids   . Anemia   . Irregular menses   . Hx of amenorrhea   . H/O gonorrhea   . Mild anemia   . 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 ABDOMINAL HYSTERECTOMY    . DILATION AND EVACUATION  1994  . TONSILLECTOMY    . VULVA /PERINEUM BIOPSY  02/2008    Family History  Problem Relation Age of Onset  . Colon polyps Sister   . Diabetes Sister   . Diabetes Brother   . Diabetes Sister     Social History   Socioeconomic History  . Marital status: Married    Spouse name: Not on file  . Number of children: Not on file  . Years of education: Not on file  . Highest education level: Not on file  Occupational History  . Not on file  Social Needs  . Financial resource strain: Not on file  . Food insecurity:    Worry: Not on file    Inability: Not on file  . Transportation needs:    Medical: Not on file    Non-medical: Not on file  Tobacco Use  .  Smoking status: Never Smoker  . Smokeless tobacco: Never Used  Substance and Sexual Activity  . Alcohol use: No  . Drug use: No  . Sexual activity: Yes    Birth control/protection: Post-menopausal  Lifestyle  . Physical activity:    Days per week: Not on file    Minutes per session: Not on file  . Stress: Not on file  Relationships  . Social connections:    Talks on phone: Not on file    Gets together: Not on file    Attends religious service: Not on file    Active member of club or organization: Not on file    Attends meetings of clubs or organizations: Not on file    Relationship status: Not on file  . Intimate partner violence:    Fear of current or ex partner: Not on file    Emotionally abused: Not on file    Physically abused: Not on file    Forced sexual activity: Not on file  Other Topics Concern  . Not on file  Social History Narrative  . Not on file     Current Outpatient Medications:  .  calcium carbonate 200 MG capsule, Take  250 mg by mouth 2 (two) times daily with a meal., Disp: , Rfl:  .  fish oil-omega-3 fatty acids 1000 MG capsule, Take 2 g by mouth daily., Disp: , Rfl:  .  hydrochlorothiazide (HYDRODIURIL) 25 MG tablet, Take 1 tablet (25 mg total) by mouth daily., Disp: 90 tablet, Rfl: 4 .  metoprolol tartrate (LOPRESSOR) 50 MG tablet, Take 1 tablet (50 mg total) by mouth daily., Disp: 90 tablet, Rfl: 2 .  Multiple Vitamin (MULTIVITAMIN) tablet, Take 1 tablet by mouth daily., Disp: , Rfl:  .  Diclofenac Sodium (PENNSAID) 2 % SOLN, Place 2 application onto the skin 2 (two) times daily. (Patient not taking: Reported on 08/19/2018), Disp: 112 g, Rfl: 3  Allergies  Allergen Reactions  . Shrimp [Shellfish Allergy] Swelling     ROS See HPI  Objective  Vitals:   08/19/18 1045  BP: 126/80  Pulse: 63  SpO2: 97%  Weight: 167 lb (75.8 kg)  Height: 5\' 3"  (1.6 m)    Body mass index is 29.58 kg/m.  Physical Exam Vital signs reviewed. Constitutional: Patient appears well-developed and well-nourished. No distress.  HENT: Head: Normocephalic and atraumatic.  Nose: Nose normal. Mouth/Throat: Oropharynx is clear and moist. No oropharyngeal exudate.  Eyes: Conjunctivae and EOM are normal. Pupils are equal, round, and reactive to light. No scleral icterus.  Neck: Normal range of motion. Neck supple.  Cardiovascular: Normal rate, regular rhythm and normal heart sounds.  No murmur heard. No BLE edema. Distal pulses intact. Pulmonary/Chest: Effort normal and breath sounds normal. No respiratory distress. Neurological: She is alert and oriented to person, place, and time. No cranial nerve deficit. Coordination, balance, strength, speech and gait are normal.  Skin: Skin is warm and dry. No rash noted. No erythema.  Psychiatric: Patient has a normal mood and affect. behavior is normal. Judgment and thought content normal.   Assessment & Plan RTC for CPE   -Reviewed Health Maintenance: Need for influenza  vaccination- Flu Vaccine QUAD 36+ mos IM

## 2018-08-28 ENCOUNTER — Other Ambulatory Visit: Payer: Self-pay | Admitting: Nurse Practitioner

## 2018-08-28 DIAGNOSIS — E785 Hyperlipidemia, unspecified: Secondary | ICD-10-CM

## 2018-08-28 MED ORDER — ROSUVASTATIN CALCIUM 10 MG PO TABS: 10.0000 mg | ORAL_TABLET | Freq: Every day | ORAL | 1 refills | Status: DC

## 2018-08-31 MED FILL — ROSUVASTATIN CALCIUM 10 MG: 10 | 30 days supply | Qty: 30 | Fill #0

## 2018-10-07 ENCOUNTER — Ambulatory Visit: Payer: Self-pay | Admitting: Registered"

## 2018-10-07 DIAGNOSIS — Z6829 Body mass index (BMI) 29.0-29.9, adult: Secondary | ICD-10-CM | POA: Diagnosis not present

## 2018-10-07 DIAGNOSIS — Z01419 Encounter for gynecological examination (general) (routine) without abnormal findings: Secondary | ICD-10-CM | POA: Diagnosis not present

## 2018-10-16 ENCOUNTER — Encounter: Payer: Self-pay | Admitting: Registered"

## 2018-10-16 ENCOUNTER — Encounter: Payer: 59 | Attending: Nurse Practitioner | Admitting: Registered"

## 2018-10-16 DIAGNOSIS — I10 Essential (primary) hypertension: Secondary | ICD-10-CM | POA: Insufficient documentation

## 2018-10-16 DIAGNOSIS — R7303 Prediabetes: Secondary | ICD-10-CM | POA: Insufficient documentation

## 2018-10-16 DIAGNOSIS — Z713 Dietary counseling and surveillance: Secondary | ICD-10-CM | POA: Diagnosis not present

## 2018-10-16 DIAGNOSIS — E785 Hyperlipidemia, unspecified: Secondary | ICD-10-CM | POA: Diagnosis not present

## 2018-10-16 DIAGNOSIS — Z1231 Encounter for screening mammogram for malignant neoplasm of breast: Secondary | ICD-10-CM | POA: Diagnosis not present

## 2018-10-16 MED FILL — ROSUVASTATIN CALCIUM 10 MG: 10 | 30 days supply | Qty: 30 | Fill #1

## 2018-10-16 NOTE — Progress Notes (Signed)
  Medical Nutrition Therapy:  Appt start time: 9:20 end time:  10:10.   Assessment:  Primary concerns today: Pt states she is prediabetic and needs help. Lab results show A1c of 6.5, has been increasing since 2013. Pt states she works 7 am-5:30 pm, 4 days/week. Pt states she had pelvic reconstruction hysterectomy in Feb 2019.   Pt expectations: wants information on prediabetes to prevent diabetes.   Pt states she raised drinking sweet tea. Pt states her mom used to make sweet tea daily for every meal. Pt states she has an exercise bike at home but needs to make room for it. Pt states she has been trying to get back into working out. Pt states she is not a salad person,refers to it as "rabbit food" but has been eating them. Pt states she does not prefer salad dressing, uses fruit instead.   Pt states she gets discouraged when unable to lose weight and it has been hard for her since age 62.    Preferred Learning Style:   No preference indicated   Learning Readiness:   Contemplating  Ready  Change in progress   MEDICATIONS: See list   DIETARY INTAKE:  Usual eating pattern includes 3 meals and 0 snacks per day.  Everyday foods include peanut butter crackers, pasta, fruit, beans, vegetables, and chicken.  Avoided foods include shrimp and sometimes desserts.    24-hr recall:  B (7:30 AM): peanut butter + cheese + coffee  Snk ( AM): none  L (12-1 PM): lasagna + apple + cream cheese + blueberries Snk ( PM): none D (7 PM): pinto beans + collard greens + chicken leg  Snk ( PM): none Beverages: black coffee, ginger ale, water, sweet tea, pepsi  Usual physical activity: walking sporadically, 10,000-11,000 steps  Estimated energy needs: 1600 calories 180 g carbohydrates 120 g protein 44 g fat  Progress Towards Goal(s):  In progress.   Nutritional Diagnosis:  NB-1.1 Food and nutrition-related knowledge deficit As related to prediabetes.  As evidenced by pt verbalizes needing  nutrition education.    Intervention:  Nutrition education and counseling. Pt was educated and counseled on prediabetes, benefits of increasing fiber intake, and physical activity. Pt was in agreement with goals listed.  Goals: - Add snack between lunch and dinner such as peanut butter crackers.  - Increase fiber intake by having non-starchy vegetables with lunch and dinner.  - Increase physical activity to at least 15-20 min, 3 days/week. Can use exercise bike, yard work, walking, Social research officer, government.  Teaching Method Utilized:  Ship broker Hands on  Handouts given during visit include:  My Plate for Diabetes  Meal Ideas Sheet  Barriers to learning/adherence to lifestyle change: none identified  Demonstrated degree of understanding via:  Teach Back   Monitoring/Evaluation:  Dietary intake, exercise, and body weight in 1 month(s).

## 2018-10-16 NOTE — Patient Instructions (Addendum)
-   Add snack between lunch and dinner such as peanut butter crackers.   - Increase fiber intake by having non-starchy vegetables with lunch and dinner.   - Increase physical activity to at least 15-20 min, 3 days/week. Can use exercise bike, yard work, walking, etc.

## 2018-11-02 DIAGNOSIS — N9089 Other specified noninflammatory disorders of vulva and perineum: Secondary | ICD-10-CM | POA: Diagnosis not present

## 2018-11-02 DIAGNOSIS — L814 Other melanin hyperpigmentation: Secondary | ICD-10-CM | POA: Diagnosis not present

## 2018-11-02 DIAGNOSIS — L815 Leukoderma, not elsewhere classified: Secondary | ICD-10-CM | POA: Diagnosis not present

## 2018-11-02 MED FILL — IBUPROFEN 600 MG TABLET: 600 | 7 days supply | Qty: 30 | Fill #0

## 2018-11-12 ENCOUNTER — Encounter: Payer: Self-pay | Admitting: Gastroenterology

## 2018-11-12 ENCOUNTER — Ambulatory Visit (AMBULATORY_SURGERY_CENTER): Payer: Self-pay | Admitting: *Deleted

## 2018-11-12 VITALS — Ht 63.0 in | Wt 165.0 lb

## 2018-11-12 DIAGNOSIS — Z1211 Encounter for screening for malignant neoplasm of colon: Secondary | ICD-10-CM

## 2018-11-12 MED ORDER — NA SULFATE-K SULFATE-MG SULF 17.5-3.13-1.6 GM/177ML PO SOLN: ORAL | 0 refills | Status: DC

## 2018-11-12 MED FILL — SUPREP BOWEL PREP KIT: 17.5-3.13-1 | 1 days supply | Qty: 354 | Fill #0

## 2018-11-12 NOTE — Progress Notes (Signed)
No egg or soy allergy  No hx of anesthesia troubles or intubation troubles per pt  No diet medications  No home oxygen used or hx of sleep apnea

## 2018-11-13 ENCOUNTER — Encounter: Payer: 59 | Attending: Nurse Practitioner | Admitting: Registered"

## 2018-11-13 ENCOUNTER — Encounter: Payer: Self-pay | Admitting: Registered"

## 2018-11-13 DIAGNOSIS — E785 Hyperlipidemia, unspecified: Secondary | ICD-10-CM | POA: Insufficient documentation

## 2018-11-13 DIAGNOSIS — I10 Essential (primary) hypertension: Secondary | ICD-10-CM | POA: Diagnosis not present

## 2018-11-13 DIAGNOSIS — Z713 Dietary counseling and surveillance: Secondary | ICD-10-CM | POA: Insufficient documentation

## 2018-11-13 DIAGNOSIS — R7303 Prediabetes: Secondary | ICD-10-CM | POA: Diagnosis not present

## 2018-11-13 NOTE — Patient Instructions (Addendum)
-   Add protein to oatmeal such as eggs.   - Use exercise bike at night around 8:30 pm for 15-20 min, 3 days/week.   - You are doing great! Continue to great job having balanced meals and snacks.

## 2018-11-13 NOTE — Progress Notes (Signed)
  Medical Nutrition Therapy:  Appt start time: 9:10 end time:  9:40.   Assessment:  Primary concerns today: Pt states she is prediabetic and needs help. Lab results show A1c of 6.5, has been increasing since 2013. Pt states she works 7 am-5:30 pm, 4 days/week. Pt states she had pelvic reconstruction hysterectomy in Feb 2019.   Pt expectations: wants information on prediabetes to prevent diabetes.   Pt states she has pulled out her exercise bike since previous visit. Pt states she is more intentional about increasing vegetable intake, having them at supper mostly and trying to eat more during lunch. Pt states she sleeps 12am - 5:45 am nightly. Pt states she will make her next appt around Feb to see how A1c and cholesterol are doing after making recent changes.    Preferred Learning Style:   No preference indicated   Learning Readiness:   Contemplating  Ready  Change in progress   MEDICATIONS: See list   DIETARY INTAKE:  Usual eating pattern includes 3 meals and 0 snacks per day.  Everyday foods include peanut butter crackers, pasta, fruit, beans, vegetables, and chicken.  Avoided foods include shrimp and sometimes desserts.    24-hr recall:  B (7:30 AM): peanut butter + crackers + coffee  Snk ( AM): cheese + crackers  L (12-1 PM): string beans + chicken + macaroni and cheese  Snk ( PM): none D (7 PM): 2 pc chicken + string beans + macaroni salad Snk ( PM): none Beverages: black coffee, ginger ale, water, sweet tea, pepsi  Usual physical activity: exercise bike 10 min, 1-2 times since previous visit   Estimated energy needs: 1600 calories 180 g carbohydrates 120 g protein 44 g fat  Progress Towards Goal(s):  In progress.   Nutritional Diagnosis:  NB-1.1 Food and nutrition-related knowledge deficit As related to prediabetes.  As evidenced by pt verbalizes needing nutrition education.    Intervention:  Nutrition education and counseling. Pt was educated and counseled  on the benefits of physical activity to help with sleep, discussed going to bed earlier, ways to increase physical activity. Pt was encouraged related to recent changes made from previous goals set. Pt was in agreement with goals listed.  Goals: - Add protein to oatmeal such as eggs.  - Use exercise bike at night around 8:30 pm for 15-20 min, 3 days/week.  - You are doing great! Continue to great job having balanced  meals and snacks.   Teaching Method Utilized:  Visual Auditory Hands on  Handouts given during visit include:  none  Barriers to learning/adherence to lifestyle change: none identified  Demonstrated degree of understanding via:  Teach Back   Monitoring/Evaluation:  Dietary intake, exercise, and body weight prn. Pt states se will make follow appt after having lab work again in Feb 2020.

## 2018-11-19 ENCOUNTER — Other Ambulatory Visit: Payer: Self-pay | Admitting: Nurse Practitioner

## 2018-11-19 DIAGNOSIS — E785 Hyperlipidemia, unspecified: Secondary | ICD-10-CM

## 2018-11-19 MED FILL — METOPROLOL TARTRATE 50 MG T: 50 | 90 days supply | Qty: 90 | Fill #1

## 2018-11-19 MED FILL — HYDROCHLOROTHIAZIDE 25 MG T: 25 | 90 days supply | Qty: 90 | Fill #2

## 2018-11-19 MED FILL — ROSUVASTATIN CALCIUM 10 MG: 10 | 30 days supply | Qty: 30 | Fill #0

## 2018-11-27 ENCOUNTER — Encounter: Payer: Self-pay | Admitting: Gastroenterology

## 2018-11-27 ENCOUNTER — Ambulatory Visit (AMBULATORY_SURGERY_CENTER): Payer: 59 | Admitting: Gastroenterology

## 2018-11-27 VITALS — BP 118/69 | HR 60 | Temp 97.1°F | Resp 13 | Ht 63.0 in | Wt 165.0 lb

## 2018-11-27 DIAGNOSIS — Z1211 Encounter for screening for malignant neoplasm of colon: Secondary | ICD-10-CM | POA: Diagnosis not present

## 2018-11-27 DIAGNOSIS — D649 Anemia, unspecified: Secondary | ICD-10-CM | POA: Diagnosis not present

## 2018-11-27 DIAGNOSIS — D124 Benign neoplasm of descending colon: Secondary | ICD-10-CM | POA: Diagnosis not present

## 2018-11-27 DIAGNOSIS — I10 Essential (primary) hypertension: Secondary | ICD-10-CM | POA: Diagnosis not present

## 2018-11-27 DIAGNOSIS — K635 Polyp of colon: Secondary | ICD-10-CM | POA: Diagnosis not present

## 2018-11-27 DIAGNOSIS — E785 Hyperlipidemia, unspecified: Secondary | ICD-10-CM | POA: Diagnosis not present

## 2018-11-27 MED ORDER — SODIUM CHLORIDE 0.9 % IV SOLN: 500.0000 mL | Freq: Once | INTRAVENOUS | Status: DC

## 2018-11-27 NOTE — Patient Instructions (Signed)
Discharge instructions given. Handouts on polyps and diverticulosis. Resume previous medications. YOU HAD AN ENDOSCOPIC PROCEDURE TODAY AT THE [facility]:   Refer to the procedure report that was given to you for any specific questions about what was found during the examination.  If the procedure report does not answer your questions, please call your gastroenterologist to clarify.  If you requested that your care partner not be given the details of your procedure findings, then the procedure report has been included in a sealed envelope for you to review at your convenience later.  YOU SHOULD EXPECT: Some feelings of bloating in the abdomen. Passage of more gas than usual.  Walking can help get rid of the air that was put into your GI tract during the procedure and reduce the bloating. If you had a lower endoscopy (such as a colonoscopy or flexible sigmoidoscopy) you may notice spotting of blood in your stool or on the toilet paper. If you underwent a bowel prep for your procedure, you may not have a normal bowel movement for a few days.  Please Note:  You might notice some irritation and congestion in your nose or some drainage.  This is from the oxygen used during your procedure.  There is no need for concern and it should clear up in a day or so.  SYMPTOMS TO REPORT IMMEDIATELY:   Following lower endoscopy (colonoscopy or flexible sigmoidoscopy):  Excessive amounts of blood in the stool  Significant tenderness or worsening of abdominal pains  Swelling of the abdomen that is new, acute  Fever of 100F or higher   For urgent or emergent issues, a gastroenterologist can be reached at any hour by calling [phone].   DIET:  We do recommend a small meal at first, but then you may proceed to your regular diet.  Drink plenty of fluids but you should avoid alcoholic beverages for 24 hours.  ACTIVITY:  You should plan to take it easy for the rest of today and you should NOT  DRIVE or use heavy machinery until tomorrow (because of the sedation medicines used during the test).    FOLLOW UP: Our staff will call the number listed on your records the next business day following your procedure to check on you and address any questions or concerns that you may have regarding the information given to you following your procedure. If we do not reach you, we will leave a message.  However, if you are feeling well and you are not experiencing any problems, there is no need to return our call.  We will assume that you have returned to your regular daily activities without incident.  If any biopsies were taken you will be contacted by phone or by letter within the next 1-3 weeks.  Please call us at [phone] if you have not heard about the biopsies in 3 weeks.    SIGNATURES/CONFIDENTIALITY: You and/or your care partner have signed paperwork which will be entered into your electronic medical record.  These signatures attest to the fact that that the information above on your After Visit Summary has been reviewed and is understood.  Full responsibility of the confidentiality of this discharge information lies with you and/or your care-partner. 

## 2018-11-27 NOTE — Op Note (Signed)
[facility] Patient Name: Yesenia Larson Procedure Date: 11/27/2018 10:20 AM MRN: [id number] Endoscopist: Thornton Park MD, MD Age: 62 Referring MD:  Date of Birth: 1956-03-03 Gender: Female Account #: [id number] Procedure:                Colonoscopy Indications:              Screening for colorectal malignant neoplasm. Normal                            colonoscopy with Dr. Olevia Perches in 2009. No known                            family history of colon cancer or polyps. No                            baseline GI symptoms. Medicines:                See the Anesthesia note for documentation of the                            administered medications Procedure:                Pre-Anesthesia Assessment:                           - Prior to the procedure, a History and Physical                            was performed, and patient medications and                            allergies were reviewed. The patient's tolerance of                            previous anesthesia was also reviewed. The risks                            and benefits of the procedure and the sedation                            options and risks were discussed with the patient.                            All questions were answered, and informed consent                            was obtained. Prior Anticoagulants: The patient has                            taken no previous anticoagulant or antiplatelet                            agents. ASA Grade Assessment: II - A patient with  mild systemic disease. After reviewing the risks                            and benefits, the patient was deemed in                            satisfactory condition to undergo the procedure.                           After obtaining informed consent, the colonoscope                            was passed under direct vision. Throughout the                            procedure, the patient's blood pressure,  pulse, and                            oxygen saturations were monitored continuously. The                            Model PCF-[id number]) scope was introduced                            through the anus and advanced to the the terminal                            ileum, with identification of the appendiceal                            orifice and IC valve. The colonoscopy was performed                            without difficulty. The patient tolerated the                            procedure well. The quality of the bowel                            preparation was excellent. The terminal ileum,                            ileocecal valve, appendiceal orifice, and rectum                            were photographed. Scope In: 10:27:12 AM Scope Out: 10:41:57 AM Scope Withdrawal Time: 0 hours 10 minutes 58 seconds  Total Procedure Duration: 0 hours 14 minutes 45 seconds  Findings:                 The perianal and digital rectal examinations were                            normal.  Multiple small and large-mouthed diverticula were                            found in the sigmoid colon and descending colon.                            There was no evidence of diverticular bleeding.                           A 2 mm polyp was found in the descending colon. The                            polyp was sessile. The polyp was removed with a                            cold biopsy forceps. Resection and retrieval were                            complete. Estimated blood loss: none.                           The exam was otherwise without abnormality on                            direct and retroflexion views. Complications:            No immediate complications. Estimated Blood Loss:     Estimated blood loss: none. Impression:               - Moderate diverticulosis in the sigmoid colon and                            in the descending colon. There was no evidence of                             diverticular bleeding.                           - One 2 mm polyp in the descending colon, removed                            with a cold biopsy forceps. Resected and retrieved.                           - The examination was otherwise normal on direct                            and retroflexion views. Recommendation:           - Discharge patient to home.                           - Resume regular diet today. Follow high-fiber diet.                           -  Continue present medications.                           - Await pathology results.                           - Repeat colonoscopy in 5 years for surveillance if                            the polyp is adenomatous. If the polyp is benign,                            is not an adenoma, current guidelines suggest                            repeating the colonoscopy in 10 years or earlier                            with the onset of any new symptoms. Thornton Park MD, MD 11/27/2018 10:47:38 AM This report has been signed electronically.

## 2018-11-27 NOTE — Progress Notes (Signed)
Called to room to assist during endoscopic procedure.  Patient ID and intended procedure confirmed with present staff. Received instructions for my participation in the procedure from the performing physician.  

## 2018-11-30 ENCOUNTER — Telehealth: Payer: Self-pay | Admitting: *Deleted

## 2018-11-30 NOTE — Telephone Encounter (Signed)
  Follow up Call-  Call back number 11/27/2018  Post procedure Call Back phone  # [phone]  Permission to leave phone message Yes  Some recent data might be hidden     Patient questions:  Do you have a fever, pain , or abdominal swelling? No. Pain Score  0 *  Have you tolerated food without any problems? Yes.    Have you been able to return to your normal activities? Yes.    Do you have any questions about your discharge instructions: Diet   No. Medications  No. Follow up visit  No.  Do you have questions or concerns about your Care? No.  Actions: * If pain score is 4 or above: No action needed, pain <4.

## 2018-12-03 ENCOUNTER — Encounter: Payer: Self-pay | Admitting: Gastroenterology

## 2018-12-31 IMAGING — CR DG HAND COMPLETE 3+V*R*
3 series · 3 of 3 positions shown · non-contrast
Comparison: None.

CLINICAL DATA: Right hand pain involving the third and fourth MCP
and PIP joints after a fall yesterday. Initial encounter.

EXAM:
RIGHT HAND - COMPLETE 3+ VIEW

[view not recorded (1 of 3)]
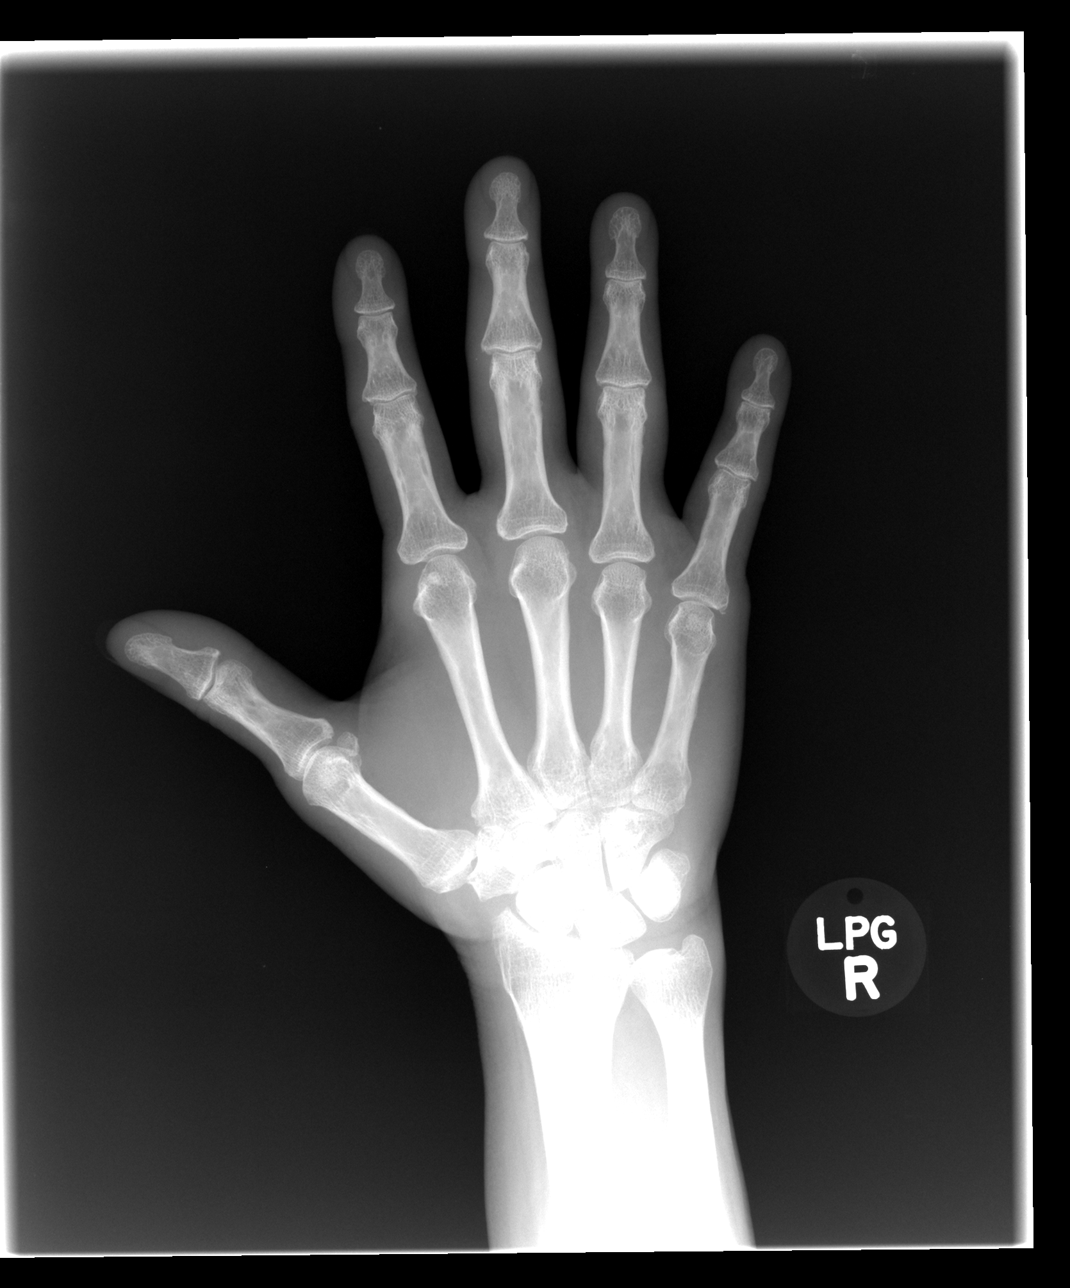

[view not recorded (2 of 3)]
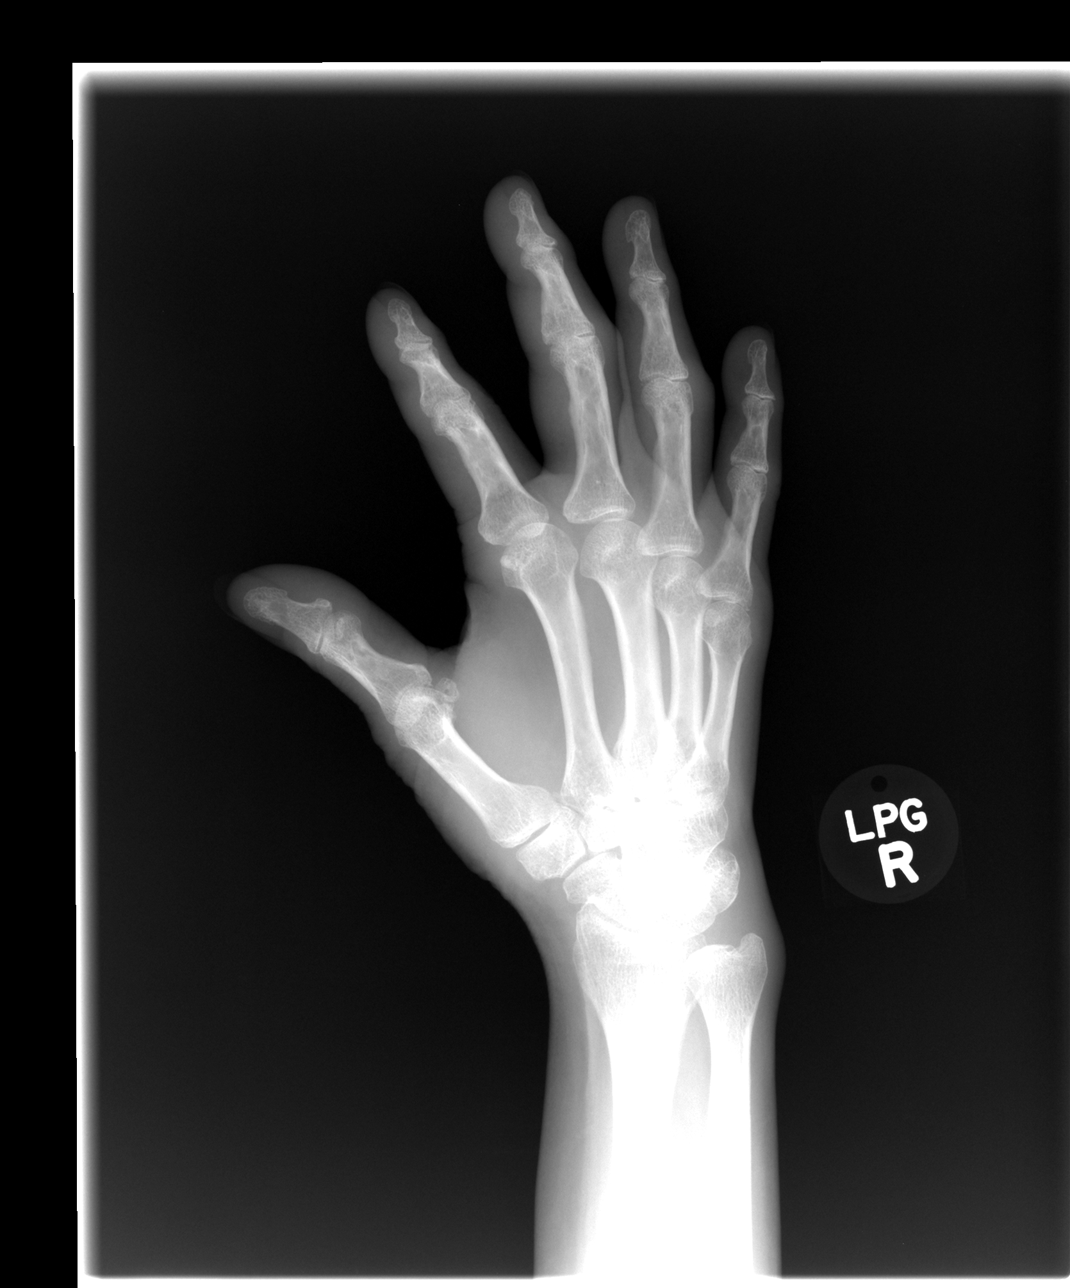

[view not recorded (3 of 3)]
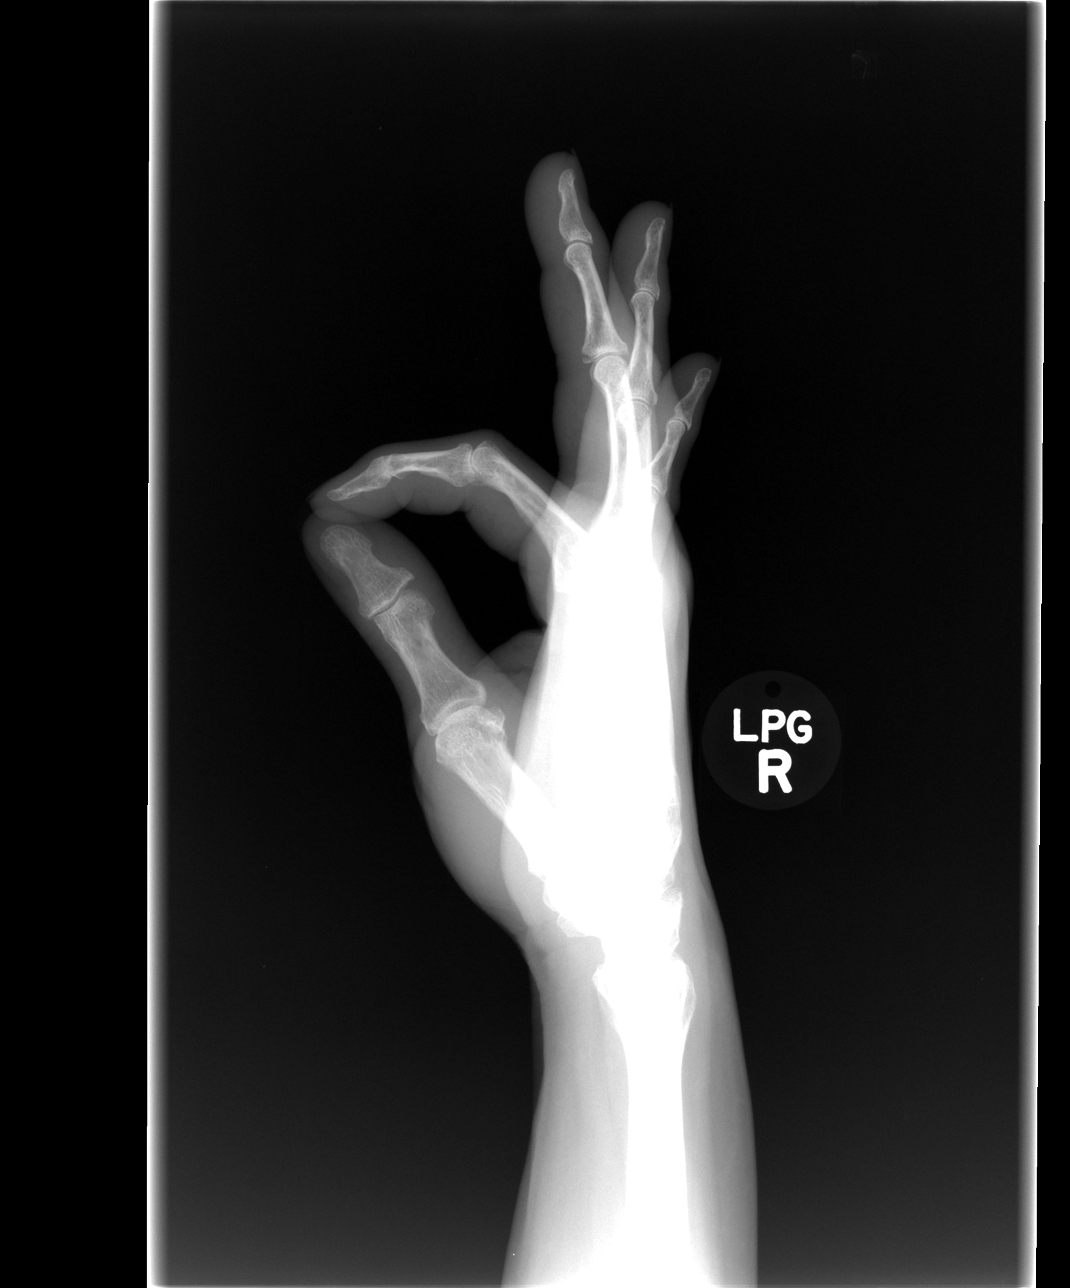

[3 of 3 positions shown; findings below may reference images not displayed]

FINDINGS: There is mild soft tissue swelling about the PIP joints of the long
and ring fingers. There are small, minimally displaced,
intra-articular avulsion fractures involving the volar base of the
middle phalanges of the ring and long fingers. There is no
dislocation. A 1.5 mm osseous fragment or soft tissue calcification
projects along the ulnar aspect of the base of the proximal phalanx
of the small finger without evidence of substantial soft tissue
swelling.
IMPRESSION: 1. Avulsion fractures of the middle phalanges of the ring and long
fingers.
2. Tiny age-indeterminate avulsion fracture versus calcification at
the base of the small finger proximal phalanx.

## 2019-02-17 MED FILL — METOPROLOL TARTRATE 50 MG T: 50 | 90 days supply | Qty: 90 | Fill #2

## 2019-02-17 MED FILL — HYDROCHLOROTHIAZIDE 25 MG T: 25 | 90 days supply | Qty: 90 | Fill #3

## 2019-03-16 NOTE — Progress Notes (Signed)
Virtual Visit via Video Note  I connected with Yesenia Larson on 03/16/19 at  9:30 AM EDT by a video enabled telemedicine application and verified that I am speaking with the correct person using two identifiers.   I discussed the limitations of evaluation and management by telemedicine and the availability of in person appointments. The patient expressed understanding and agreed to proceed.  The patient is currently at home and I am in the office.    Referring provider:Shambley, Delphia Grates, NP   History of Present Illness: This visit is an acute visit for rash.  She has a rash that started after working out in the yard last week.  She first noticed it underneath her breasts and then it kind of has spread to her lower chest.  She denies any rash on her arms, legs or back.  The rash is red dots/patches, itches and has a mild stinging to it.  She states she was sweaty that day.  She denies being around poison ivy or oak, which she has had in the past.  She did try hydrocortisone cream and a medicated powder, which have not helped much.  The cream may have helped very temporarily.  She denies fever, chills, sore throat, swelling or other cold symptoms.     Observations/Objective: Appears well and in no acute distress  It is very difficulty to see the rash via video - some mild erythema seen on chest.   Assessment and Plan:  See Problem List for Assessment and Plan of chronic medical problems.  Follow Up Instructions:    I discussed the assessment and treatment plan with the patient. The patient was provided an opportunity to ask questions and all were answered. The patient agreed with the plan and demonstrated an understanding of the instructions.   The patient was advised to call back or seek an in-person evaluation if the symptoms worsen or if the condition fails to improve as anticipated.     Binnie Rail, MD

## 2019-03-17 ENCOUNTER — Encounter: Payer: Self-pay | Admitting: Internal Medicine

## 2019-03-17 ENCOUNTER — Ambulatory Visit (INDEPENDENT_AMBULATORY_CARE_PROVIDER_SITE_OTHER): Payer: 59 | Admitting: Internal Medicine

## 2019-03-17 ENCOUNTER — Ambulatory Visit: Payer: Self-pay | Admitting: Internal Medicine

## 2019-03-17 DIAGNOSIS — R21 Rash and other nonspecific skin eruption: Secondary | ICD-10-CM | POA: Insufficient documentation

## 2019-03-17 MED ORDER — CLOTRIMAZOLE-BETAMETHASONE 1-0.05 % EX CREA: 1.0000 "application " | TOPICAL_CREAM | Freq: Two times a day (BID) | CUTANEOUS | 0 refills | Status: DC

## 2019-03-17 MED FILL — CLOTRIMAZOLE-BETAMETHASONE: 1-0.05 | 30 days supply | Qty: 30 | Fill #0

## 2019-03-17 NOTE — Assessment & Plan Note (Signed)
Started last week under breasts and now in lower chest She denies poison ivy/oak exposure Likely yeast infection Will try lotrisone cream BID If no improvement she will let me know

## 2019-05-07 MED FILL — METOPROLOL TARTRATE 50 MG T: 50 | 90 days supply | Qty: 90 | Fill #0

## 2019-05-07 MED FILL — ROSUVASTATIN CALCIUM 10 MG: 10 | 30 days supply | Qty: 30 | Fill #1

## 2019-05-07 MED FILL — HYDROCHLOROTHIAZIDE 25 MG T: 25 | 90 days supply | Qty: 90 | Fill #0

## 2019-05-19 DIAGNOSIS — Z9889 Other specified postprocedural states: Secondary | ICD-10-CM | POA: Diagnosis not present

## 2019-05-19 DIAGNOSIS — N904 Leukoplakia of vulva: Secondary | ICD-10-CM | POA: Diagnosis not present

## 2019-05-19 MED FILL — CLOBETASOL 0.05% GEL: 0.05 | 20 days supply | Qty: 60 | Fill #0

## 2019-07-28 MED FILL — CLOBETASOL 0.05% GEL: 0.05 | 20 days supply | Qty: 60 | Fill #1

## 2019-07-28 MED FILL — ROSUVASTATIN CALCIUM 10 MG: 10 | 30 days supply | Qty: 30 | Fill #2

## 2019-07-28 MED FILL — METOPROLOL TARTRATE 50 MG T: 50 | 90 days supply | Qty: 90 | Fill #1

## 2019-07-28 MED FILL — HYDROCHLOROTHIAZIDE 25 MG T: 25 | 90 days supply | Qty: 90 | Fill #1

## 2019-10-08 DIAGNOSIS — Z1231 Encounter for screening mammogram for malignant neoplasm of breast: Secondary | ICD-10-CM | POA: Diagnosis not present

## 2019-10-08 DIAGNOSIS — Z6829 Body mass index (BMI) 29.0-29.9, adult: Secondary | ICD-10-CM | POA: Diagnosis not present

## 2019-10-08 DIAGNOSIS — Z01419 Encounter for gynecological examination (general) (routine) without abnormal findings: Secondary | ICD-10-CM | POA: Diagnosis not present

## 2019-10-08 DIAGNOSIS — M8580 Other specified disorders of bone density and structure, unspecified site: Secondary | ICD-10-CM | POA: Diagnosis not present

## 2019-11-10 ENCOUNTER — Telehealth: Payer: Self-pay

## 2019-11-10 NOTE — Telephone Encounter (Signed)
Patient has made an appointment with you through [facility], but I don't see where you agreed to take her on as a patient. Would you accept her as a TOC from [facility]?

## 2019-11-11 NOTE — Telephone Encounter (Signed)
It is okay I will go ahead and accept her.  Thank you for checking.

## 2019-11-16 ENCOUNTER — Other Ambulatory Visit: Payer: Self-pay | Admitting: Internal Medicine

## 2019-11-16 DIAGNOSIS — I10 Essential (primary) hypertension: Secondary | ICD-10-CM

## 2019-11-17 MED FILL — METOPROLOL TARTRATE 50 MG T: 50 | 90 days supply | Qty: 90 | Fill #0

## 2019-11-17 MED FILL — HYDROCHLOROTHIAZIDE 25 MG T: 25 | 90 days supply | Qty: 90 | Fill #0

## 2019-11-23 NOTE — Patient Instructions (Addendum)
  Tests ordered today. Your results will be released to [facility] (or called to you) after review.  If any changes need to be made, you will be notified at that same time.  All other Health Maintenance issues reviewed.   All recommended immunizations and age-appropriate screenings are up-to-date or discussed.  Prevnar pneumonia immunization administered today.   Medications reviewed and updated.  Changes include :  none   Your prescription(s) have been submitted to your pharmacy. Please take as directed and contact our office if you believe you are having problem(s) with the medication(s).   Please followup in 6 months

## 2019-11-23 NOTE — Progress Notes (Signed)
Subjective:    Patient ID: Yesenia Larson, female    DOB: Jan 05, 1956, 63 y.o.   MRN: [id number]  HPI The patient is here for follow up.  She is not exercising regularly.  She walks when the weather is nicer. She has gained some weight.    Hypertension: She is taking her medication daily. She is compliant with a low sodium diet.  She denies chest pain, palpitations, edema, shortness of breath and regular headaches. She does monitor her blood pressure at home.    Hyperlipidemia: She is not taking her medication daily. She is compliant with a low fat/cholesterol diet. She denies myalgias.   Prediabetes:  She is fairly compliant with a low sugar/carbohydrate diet.  She is not exercising regularly.  Osteopenia: she walks, but not regularly.  She is taking calcium and vitamin d.  She has a dexa scheduled.  Medications and allergies reviewed with patient and updated if appropriate.  Patient Active Problem List   Diagnosis Date Noted  . Rash and nonspecific skin eruption 03/17/2019  . Hyperlipidemia 08/19/2018  . Pre-diabetes 05/05/2018  . Biceps tendinitis of both shoulders 01/16/2017  . Essential hypertension 01/13/2015  . Lichen sclerosus   . H/O vitamin D deficiency   . Osteopenia   . Fibroids   . Anemia   . H/O gonorrhea   . Mild anemia   . 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 calcium carbonate 200 MG capsule Take 250 mg by mouth 2 (two) times daily with a meal.    . fish oil-omega-3 fatty acids 1000 MG capsule Take 2 g by mouth daily.    . Glucosamine HCl (GLUCOSAMINE PO) Take by mouth 2 (two) times daily.    . hydrochlorothiazide (HYDRODIURIL) 25 MG tablet TAKE 1 TABLET (25 MG TOTAL) BY MOUTH DAILY. 90 tablet 0  . metoprolol tartrate (LOPRESSOR) 50 MG tablet TAKE 1 TABLET (50 MG TOTAL) BY MOUTH DAILY. 90 tablet 0  . Multiple Vitamin (MULTIVITAMIN) tablet Take 1 tablet by mouth daily.     No  current facility-administered medications on file prior to visit.    Past Medical History:  Diagnosis Date  . Allergy   . Anemia    H/O  . Arthritis   . Candidiasis    H/O  . Fibroids   . H/O gonorrhea   . H/O vitamin D deficiency   . Hx of amenorrhea   . Hyperlipidemia   . Hypertension   . Irregular menses    H/O  . Lichen sclerosus   . Mild anemia   . Osteopenia   . Pelvic relaxation     Past Surgical History:  Procedure Laterality Date  . ABDOMINAL HYSTERECTOMY    . COLONOSCOPY    . DILATION AND EVACUATION  1994  . TONSILLECTOMY    . VULVA /PERINEUM BIOPSY  02/2008    Social History   Socioeconomic History  . Marital status: Married    Spouse name: Not on file  . Number of children: Not on file  . Years of education: Not on file  . Highest education level: Not on file  Occupational History  . Not on file  Tobacco Use  . Smoking status: Never Smoker  . Smokeless tobacco: Never Used  Substance and Sexual Activity  . Alcohol use: No  . Drug use: No  . Sexual activity: Yes    Birth control/protection: Post-menopausal  Other Topics  Concern  . Not on file  Social History Narrative  . Not on file   Social Determinants of Health   Financial Resource Strain:   . Difficulty of Paying Living Expenses: Not on file  Food Insecurity:   . Worried About Charity fundraiser in the Last Year: Not on file  . Ran Out of Food in the Last Year: Not on file  Transportation Needs:   . Lack of Transportation (Medical): Not on file  . Lack of Transportation (Non-Medical): Not on file  Physical Activity:   . Days of Exercise per Week: Not on file  . Minutes of Exercise per Session: Not on file  Stress:   . Feeling of Stress : Not on file  Social Connections:   . Frequency of Communication with Friends and Family: Not on file  . Frequency of Social Gatherings with Friends and Family: Not on file  . Attends Religious Services: Not on file  . Active Member of Clubs or  Organizations: Not on file  . Attends Archivist Meetings: Not on file  . Marital Status: Not on file    Family History  Problem Relation Age of Onset  . Colon polyps Sister   . Diabetes Sister   . Diabetes Brother   . Diabetes Sister   . Colon cancer Neg Hx   . Rectal cancer Neg Hx   . Stomach cancer Neg Hx   . Esophageal cancer Neg Hx     Review of Systems  Constitutional: Negative for chills and fever.  Eyes: Negative for visual disturbance.  Respiratory: Negative for cough, shortness of breath and wheezing.   Cardiovascular: Negative for chest pain, palpitations and leg swelling.  Gastrointestinal: Negative for abdominal pain.       No gerd  Musculoskeletal: Positive for arthralgias (fingers).  Neurological: Negative for light-headedness, numbness and headaches.       Objective:   Vitals:   11/24/19 0915  BP: (!) 152/80  Pulse: 65  Resp: 16  Temp: 98.5 F (36.9 C)  SpO2: 98%   BP Readings from Last 3 Encounters:  11/24/19 (!) 152/80  11/27/18 118/69  08/19/18 126/80   Wt Readings from Last 3 Encounters:  11/24/19 171 lb 12.8 oz (77.9 kg)  11/27/18 165 lb (74.8 kg)  11/12/18 165 lb (74.8 kg)   Body mass index is 30.43 kg/m.   Physical Exam    Constitutional: Appears well-developed and well-nourished. No distress.  HENT:  Head: Normocephalic and atraumatic.  Neck: Neck supple. No tracheal deviation present. No thyromegaly present.  No cervical lymphadenopathy Cardiovascular: Normal rate, regular rhythm and normal heart sounds.  No murmur heard. No carotid bruit .  No edema Pulmonary/Chest: Effort normal and breath sounds normal. No respiratory distress. No has no wheezes. No rales.  Abdomen: soft, NT, ND Skin: Skin is warm and dry. Not diaphoretic. Several benign appearing moles under breasts and panus Psychiatric: Normal mood and affect. Behavior is normal.      Assessment & Plan:    See Problem List for Assessment and Plan of  chronic medical problems.   This visit occurred during the SARS-CoV-2 public health emergency.  Safety protocols were in place, including screening questions prior to the visit, additional usage of staff PPE, and extensive cleaning of exam room while observing appropriate contact time as indicated for disinfecting solutions.

## 2019-11-24 ENCOUNTER — Other Ambulatory Visit: Payer: Self-pay

## 2019-11-24 ENCOUNTER — Other Ambulatory Visit (INDEPENDENT_AMBULATORY_CARE_PROVIDER_SITE_OTHER): Payer: 59

## 2019-11-24 ENCOUNTER — Ambulatory Visit (INDEPENDENT_AMBULATORY_CARE_PROVIDER_SITE_OTHER): Payer: 59 | Admitting: Internal Medicine

## 2019-11-24 ENCOUNTER — Encounter: Payer: Self-pay | Admitting: Internal Medicine

## 2019-11-24 VITALS — BP 152/80 | HR 65 | Temp 98.5°F | Resp 16 | Ht 63.0 in | Wt 171.8 lb

## 2019-11-24 DIAGNOSIS — I10 Essential (primary) hypertension: Secondary | ICD-10-CM

## 2019-11-24 DIAGNOSIS — Z23 Encounter for immunization: Secondary | ICD-10-CM

## 2019-11-24 DIAGNOSIS — E785 Hyperlipidemia, unspecified: Secondary | ICD-10-CM

## 2019-11-24 DIAGNOSIS — Z8639 Personal history of other endocrine, nutritional and metabolic disease: Secondary | ICD-10-CM

## 2019-11-24 DIAGNOSIS — R7303 Prediabetes: Secondary | ICD-10-CM

## 2019-11-24 DIAGNOSIS — M8589 Other specified disorders of bone density and structure, multiple sites: Secondary | ICD-10-CM | POA: Diagnosis not present

## 2019-11-24 LAB — CBC WITH DIFFERENTIAL/PLATELET
Basophils Absolute: 0.1 10*3/uL (ref 0.0–0.1)
Basophils Relative: 1 % (ref 0.0–3.0)
Eosinophils Absolute: 0.1 10*3/uL (ref 0.0–0.7)
Eosinophils Relative: 1.6 % (ref 0.0–5.0)
HCT: 36.5 % (ref 36.0–46.0)
Hemoglobin: 12.5 g/dL (ref 12.0–15.0)
Lymphocytes Relative: 36.3 % (ref 12.0–46.0)
Lymphs Abs: 2.6 10*3/uL (ref 0.7–4.0)
MCHC: 34.1 g/dL (ref 30.0–36.0)
MCV: 89.9 fl (ref 78.0–100.0)
Monocytes Absolute: 0.6 10*3/uL (ref 0.1–1.0)
Monocytes Relative: 8.5 % (ref 3.0–12.0)
Neutro Abs: 3.8 10*3/uL (ref 1.4–7.7)
Neutrophils Relative %: 52.6 % (ref 43.0–77.0)
Platelets: 288 10*3/uL (ref 150.0–400.0)
RBC: 4.07 Mil/uL (ref 3.87–5.11)
RDW: 13.7 % (ref 11.5–15.5)
WBC: 7.2 10*3/uL (ref 4.0–10.5)

## 2019-11-24 LAB — LIPID PANEL
Cholesterol: 255 mg/dL — ABNORMAL HIGH (ref 0–200)
HDL: 40.5 mg/dL (ref >39.00)
LDL Cholesterol: 188 mg/dL — ABNORMAL HIGH (ref 0–99)
NonHDL: 214.78
Total CHOL/HDL Ratio: 6
Triglycerides: 135 mg/dL (ref 0.0–149.0)
VLDL: 27 mg/dL (ref 0.0–40.0)

## 2019-11-24 LAB — COMPREHENSIVE METABOLIC PANEL
ALT: 57 U/L — ABNORMAL HIGH (ref 0–35)
AST: 41 U/L — ABNORMAL HIGH (ref 0–37)
Albumin: 3.8 g/dL (ref 3.5–5.2)
Alkaline Phosphatase: 72 U/L (ref 39–117)
BUN: 11 mg/dL (ref 6–23)
CO2: 28 mEq/L (ref 19–32)
Calcium: 9.3 mg/dL (ref 8.4–10.5)
Chloride: 103 mEq/L (ref 96–112)
Creatinine, Ser: 0.74 mg/dL (ref 0.40–1.20)
GFR: 95.11 mL/min (ref >60.00)
Glucose, Bld: 132 mg/dL — ABNORMAL HIGH (ref 70–99)
Potassium: 3.4 mEq/L — ABNORMAL LOW (ref 3.5–5.1)
Sodium: 139 mEq/L (ref 135–145)
Total Bilirubin: 0.4 mg/dL (ref 0.2–1.2)
Total Protein: 7.1 g/dL (ref 6.0–8.3)

## 2019-11-24 LAB — HEMOGLOBIN A1C: Hgb A1c MFr Bld: 6.4 % (ref 4.6–6.5)

## 2019-11-24 LAB — VITAMIN D 25 HYDROXY (VIT D DEFICIENCY, FRACTURES): VITD: 34.45 ng/mL (ref 30.00–100.00)

## 2019-11-24 LAB — TSH: TSH: 3.39 u[IU]/mL (ref 0.35–4.50)

## 2019-11-24 MED ORDER — CLOTRIMAZOLE-BETAMETHASONE 1-0.05 % EX CREA: 1.0000 "application " | TOPICAL_CREAM | Freq: Two times a day (BID) | CUTANEOUS | 0 refills | Status: AC

## 2019-11-24 MED ORDER — ROSUVASTATIN CALCIUM 10 MG PO TABS: 10.0000 mg | ORAL_TABLET | Freq: Every day | ORAL | 1 refills | Status: DC

## 2019-11-24 MED ORDER — NYSTATIN 100000 UNIT/GM EX POWD: Freq: Four times a day (QID) | CUTANEOUS | 5 refills | Status: DC

## 2019-11-24 MED FILL — NYSTATIN 100,000 UNIT/GM PO: 100000 | 30 days supply | Qty: 60 | Fill #0

## 2019-11-24 MED FILL — ROSUVASTATIN CALCIUM 10 MG: 10 | 90 days supply | Qty: 90 | Fill #0

## 2019-11-24 MED FILL — CLOTRIMAZOLE-BETAMETHASONE: 1-0.05 | 14 days supply | Qty: 30 | Fill #0

## 2019-11-24 NOTE — Assessment & Plan Note (Signed)
Check level 

## 2019-11-24 NOTE — Assessment & Plan Note (Signed)
Check a1c Low sugar / carb diet Stressed regular exercise   

## 2019-11-24 NOTE — Assessment & Plan Note (Addendum)
BP Readings from Last 3 Encounters:  11/24/19 (!) 152/80  11/27/18 118/69  08/19/18 126/80   BP elevated here today, but typically well controlled Current regimen effective and well tolerated Continue current medications at current doses cmp

## 2019-11-24 NOTE — Assessment & Plan Note (Signed)
Not taking statin daily - advised taking it daily Check lipids now Encouraged regular exercise, weight loss

## 2019-11-24 NOTE — Assessment & Plan Note (Signed)
Walks, but not regularly - encouraged regular exercise Continue calcium, vitamin d dexa scheduled

## 2019-11-25 ENCOUNTER — Encounter: Payer: Self-pay | Admitting: Internal Medicine

## 2019-11-30 DIAGNOSIS — H2513 Age-related nuclear cataract, bilateral: Secondary | ICD-10-CM | POA: Diagnosis not present

## 2019-11-30 DIAGNOSIS — H16223 Keratoconjunctivitis sicca, not specified as Sjogren's, bilateral: Secondary | ICD-10-CM | POA: Diagnosis not present

## 2019-11-30 DIAGNOSIS — H35363 Drusen (degenerative) of macula, bilateral: Secondary | ICD-10-CM | POA: Diagnosis not present

## 2019-11-30 DIAGNOSIS — H43813 Vitreous degeneration, bilateral: Secondary | ICD-10-CM | POA: Diagnosis not present

## 2019-11-30 MED FILL — RESTASIS 0.05% EYE EMULSION: 0.05 | 90 days supply | Qty: 90 | Fill #0

## 2019-12-01 ENCOUNTER — Encounter: Payer: 59 | Admitting: Internal Medicine

## 2019-12-18 LAB — HM DEXA SCAN

## 2019-12-28 DIAGNOSIS — M8580 Other specified disorders of bone density and structure, unspecified site: Secondary | ICD-10-CM | POA: Diagnosis not present

## 2020-02-11 ENCOUNTER — Other Ambulatory Visit: Payer: Self-pay | Admitting: Internal Medicine

## 2020-02-11 DIAGNOSIS — I10 Essential (primary) hypertension: Secondary | ICD-10-CM

## 2020-02-11 MED FILL — METOPROLOL TARTRATE 50 MG T: 50 | 90 days supply | Qty: 90 | Fill #0

## 2020-02-11 MED FILL — HYDROCHLOROTHIAZIDE 25 MG T: 25 | 90 days supply | Qty: 90 | Fill #0

## 2020-05-09 MED FILL — ROSUVASTATIN CALCIUM 10 MG: 10 | 90 days supply | Qty: 90 | Fill #1

## 2020-05-09 MED FILL — NYSTATIN 100,000 UNIT/GM PO: 100000 | 30 days supply | Qty: 60 | Fill #1

## 2020-05-23 NOTE — Progress Notes (Signed)
Subjective:    Patient ID: Yesenia Larson, female    DOB: 10/09/1956, 64 y.o.   MRN: [id number]  HPI The patient is here for follow up of their chronic medical problems, including htn, hyperlipidemia, prediabetes, osteopenia  She is taking all of her medications as prescribed.   She is exercising regularly - she is walking daily - just restarted.     She has not been watching her sugars/carbs over the past year or so.     Medications and allergies reviewed with patient and updated if appropriate.  Patient Active Problem List   Diagnosis Date Noted  . Rash and nonspecific skin eruption 03/17/2019  . Hyperlipidemia 08/19/2018  . Pre-diabetes 05/05/2018  . Biceps tendinitis of both shoulders 01/16/2017  . Essential hypertension 01/13/2015  . Lichen sclerosus   . H/O vitamin D deficiency   . Osteopenia   . Fibroids   . Anemia   . H/O gonorrhea   . Mild anemia   . 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 calcium carbonate 200 MG capsule Take 250 mg by mouth 2 (two) times daily with a meal.    . clotrimazole-betamethasone (LOTRISONE) cream Apply 1 application topically 2 (two) times daily. 30 g 0  . fish oil-omega-3 fatty acids 1000 MG capsule Take 2 g by mouth daily.    . Glucosamine HCl (GLUCOSAMINE PO) Take by mouth 2 (two) times daily.    . hydrochlorothiazide (HYDRODIURIL) 25 MG tablet TAKE 1 TABLET (25 MG TOTAL) BY MOUTH DAILY. 90 tablet 0  . metoprolol tartrate (LOPRESSOR) 50 MG tablet TAKE 1 TABLET (50 MG TOTAL) BY MOUTH DAILY. 90 tablet 0  . Multiple Vitamin (MULTIVITAMIN) tablet Take 1 tablet by mouth daily.    Marland Kitchen nystatin (MYCOSTATIN/NYSTOP) powder Apply topically 4 (four) times daily. 60 g 5  . RESTASIS 0.05 % ophthalmic emulsion     . rosuvastatin (CRESTOR) 10 MG tablet Take 1 tablet (10 mg total) by mouth daily. 90 tablet 1   No current facility-administered medications on file prior  to visit.    Past Medical History:  Diagnosis Date  . Allergy   . Anemia    H/O  . Arthritis   . Candidiasis    H/O  . Fibroids   . H/O gonorrhea   . H/O vitamin D deficiency   . Hx of amenorrhea   . Hyperlipidemia   . Hypertension   . Irregular menses    H/O  . Lichen sclerosus   . Mild anemia   . Osteopenia   . Pelvic relaxation     Past Surgical History:  Procedure Laterality Date  . ABDOMINAL HYSTERECTOMY    . COLONOSCOPY    . DILATION AND EVACUATION  1994  . TONSILLECTOMY    . VULVA /PERINEUM BIOPSY  02/2008    Social History   Socioeconomic History  . Marital status: Married    Spouse name: Not on file  . Number of children: Not on file  . Years of education: Not on file  . Highest education level: Not on file  Occupational History  . Not on file  Tobacco Use  . Smoking status: Never Smoker  . Smokeless tobacco: Never Used  Vaping Use  . Vaping Use: Never used  Substance and Sexual Activity  . Alcohol use: No  . Drug use: No  . Sexual activity: Yes    Birth control/protection: Post-menopausal  Other Topics Concern  . Not on file  Social History Narrative  . Not on file   Social Determinants of Health   Financial Resource Strain:   . Difficulty of Paying Living Expenses:   Food Insecurity:   . Worried About Charity fundraiser in the Last Year:   . Arboriculturist in the Last Year:   Transportation Needs:   . Film/video editor (Medical):   Marland Kitchen Lack of Transportation (Non-Medical):   Physical Activity:   . Days of Exercise per Week:   . Minutes of Exercise per Session:   Stress:   . Feeling of Stress :   Social Connections:   . Frequency of Communication with Friends and Family:   . Frequency of Social Gatherings with Friends and Family:   . Attends Religious Services:   . Active Member of Clubs or Organizations:   . Attends Archivist Meetings:   Marland Kitchen Marital Status:     Family History  Problem Relation Age of Onset  .  Colon polyps Sister   . Diabetes Sister   . Diabetes Brother   . Diabetes Sister   . Colon cancer Neg Hx   . Rectal cancer Neg Hx   . Stomach cancer Neg Hx   . Esophageal cancer Neg Hx     Review of Systems  Constitutional: Negative for chills and fever.  Respiratory: Positive for cough (allergy related). Negative for shortness of breath and wheezing.   Cardiovascular: Positive for leg swelling (occ, with salt intake). Negative for chest pain and palpitations.  Neurological: Negative for light-headedness and headaches.       Objective:   Vitals:   05/24/20 0910  BP: 130/72  Pulse: 81  Temp: 98 F (36.7 C)  SpO2: 99%   BP Readings from Last 3 Encounters:  05/24/20 130/72  11/24/19 (!) 152/80  11/27/18 118/69   Wt Readings from Last 3 Encounters:  05/24/20 169 lb (76.7 kg)  11/24/19 171 lb 12.8 oz (77.9 kg)  11/27/18 165 lb (74.8 kg)   Body mass index is 29.94 kg/m.   Physical Exam    Constitutional: Appears well-developed and well-nourished. No distress.  HENT:  Head: Normocephalic and atraumatic.  Neck: Neck supple. No tracheal deviation present. No thyromegaly present.  No cervical lymphadenopathy Cardiovascular: Normal rate, regular rhythm and normal heart sounds.   No murmur heard. No carotid bruit .  No edema Pulmonary/Chest: Effort normal and breath sounds normal. No respiratory distress. No has no wheezes. No rales.  Skin: Skin is warm and dry. Not diaphoretic.  Psychiatric: Normal mood and affect. Behavior is normal.      Assessment & Plan:    See Problem List for Assessment and Plan of chronic medical problems.    This visit occurred during the SARS-CoV-2 public health emergency.  Safety protocols were in place, including screening questions prior to the visit, additional usage of staff PPE, and extensive cleaning of exam room while observing appropriate contact time as indicated for disinfecting solutions.

## 2020-05-23 NOTE — Patient Instructions (Addendum)
  Blood work was ordered.     Medications reviewed and updated.  Changes include :   none  Your prescription(s) have been submitted to your pharmacy. Please take as directed and contact our office if you believe you are having problem(s) with the medication(s).   Please followup in 6 months   

## 2020-05-24 ENCOUNTER — Other Ambulatory Visit: Payer: Self-pay

## 2020-05-24 ENCOUNTER — Encounter: Payer: Self-pay | Admitting: Internal Medicine

## 2020-05-24 ENCOUNTER — Other Ambulatory Visit: Payer: Self-pay | Admitting: Internal Medicine

## 2020-05-24 ENCOUNTER — Ambulatory Visit: Payer: 59 | Admitting: Internal Medicine

## 2020-05-24 VITALS — BP 130/72 | HR 81 | Temp 98.0°F | Ht 63.0 in | Wt 169.0 lb

## 2020-05-24 DIAGNOSIS — R7303 Prediabetes: Secondary | ICD-10-CM | POA: Diagnosis not present

## 2020-05-24 DIAGNOSIS — E785 Hyperlipidemia, unspecified: Secondary | ICD-10-CM | POA: Diagnosis not present

## 2020-05-24 DIAGNOSIS — I10 Essential (primary) hypertension: Secondary | ICD-10-CM | POA: Diagnosis not present

## 2020-05-24 DIAGNOSIS — E782 Mixed hyperlipidemia: Secondary | ICD-10-CM | POA: Diagnosis not present

## 2020-05-24 LAB — COMPREHENSIVE METABOLIC PANEL
ALT: 34 U/L (ref 0–35)
AST: 28 U/L (ref 0–37)
Albumin: 3.9 g/dL (ref 3.5–5.2)
Alkaline Phosphatase: 78 U/L (ref 39–117)
BUN: 12 mg/dL (ref 6–23)
CO2: 29 mEq/L (ref 19–32)
Calcium: 9.3 mg/dL (ref 8.4–10.5)
Chloride: 102 mEq/L (ref 96–112)
Creatinine, Ser: 0.7 mg/dL (ref 0.40–1.20)
GFR: 101.25 mL/min (ref >60.00)
Glucose, Bld: 156 mg/dL — ABNORMAL HIGH (ref 70–99)
Potassium: 3.5 mEq/L (ref 3.5–5.1)
Sodium: 140 mEq/L (ref 135–145)
Total Bilirubin: 0.5 mg/dL (ref 0.2–1.2)
Total Protein: 6.7 g/dL (ref 6.0–8.3)

## 2020-05-24 LAB — LIPID PANEL
Cholesterol: 159 mg/dL (ref 0–200)
HDL: 42.8 mg/dL (ref >39.00)
LDL Cholesterol: 98 mg/dL (ref 0–99)
NonHDL: 116.42
Total CHOL/HDL Ratio: 4
Triglycerides: 92 mg/dL (ref 0.0–149.0)
VLDL: 18.4 mg/dL (ref 0.0–40.0)

## 2020-05-24 LAB — HEMOGLOBIN A1C: Hgb A1c MFr Bld: 7.7 % — ABNORMAL HIGH (ref 4.6–6.5)

## 2020-05-24 MED ORDER — ROSUVASTATIN CALCIUM 10 MG PO TABS: 10.0000 mg | ORAL_TABLET | Freq: Every day | ORAL | 1 refills | Status: DC

## 2020-05-24 MED ORDER — METOPROLOL TARTRATE 50 MG PO TABS: 50.0000 mg | ORAL_TABLET | Freq: Every day | ORAL | 1 refills | Status: DC

## 2020-05-24 MED ORDER — HYDROCHLOROTHIAZIDE 25 MG PO TABS: 25.0000 mg | ORAL_TABLET | Freq: Every day | ORAL | 1 refills | Status: DC

## 2020-05-24 MED FILL — HYDROCHLOROTHIAZIDE 25 MG T: 25 | 90 days supply | Qty: 90 | Fill #0

## 2020-05-24 MED FILL — METOPROLOL TARTRATE 50 MG T: 50 | 90 days supply | Qty: 90 | Fill #0

## 2020-05-24 NOTE — Assessment & Plan Note (Signed)
Chronic Check lipid panel  Continue daily statin Regular exercise and healthy diet encouraged  

## 2020-05-24 NOTE — Addendum Note (Signed)
Addended by: Binnie Rail on: 05/24/2020 09:26 AM   Modules accepted: Orders

## 2020-05-24 NOTE — Assessment & Plan Note (Signed)
Chronic BP well controlled Current regimen effective and well tolerated Continue current medications at current doses cmp  

## 2020-05-24 NOTE — Assessment & Plan Note (Signed)
Chronic Check a1c Low sugar / carb diet Stressed regular exercise  

## 2020-05-28 ENCOUNTER — Encounter: Payer: Self-pay | Admitting: Internal Medicine

## 2020-08-18 MED FILL — ROSUVASTATIN CALCIUM 10 MG: 10 | 90 days supply | Qty: 90 | Fill #0

## 2020-08-18 MED FILL — METOPROLOL TARTRATE 50 MG T: 50 | 90 days supply | Qty: 90 | Fill #1

## 2020-08-18 MED FILL — HYDROCHLOROTHIAZIDE 25 MG T: 25 | 90 days supply | Qty: 90 | Fill #1

## 2020-10-06 DIAGNOSIS — Z6829 Body mass index (BMI) 29.0-29.9, adult: Secondary | ICD-10-CM | POA: Diagnosis not present

## 2020-10-06 DIAGNOSIS — Z01411 Encounter for gynecological examination (general) (routine) with abnormal findings: Secondary | ICD-10-CM | POA: Diagnosis not present

## 2020-10-06 DIAGNOSIS — M8580 Other specified disorders of bone density and structure, unspecified site: Secondary | ICD-10-CM | POA: Diagnosis not present

## 2020-10-06 DIAGNOSIS — Z1231 Encounter for screening mammogram for malignant neoplasm of breast: Secondary | ICD-10-CM | POA: Diagnosis not present

## 2020-11-20 ENCOUNTER — Other Ambulatory Visit: Payer: Self-pay | Admitting: Internal Medicine

## 2020-11-20 DIAGNOSIS — I10 Essential (primary) hypertension: Secondary | ICD-10-CM

## 2020-11-20 MED FILL — ROSUVASTATIN CALCIUM 10 MG: 10 | 90 days supply | Qty: 90 | Fill #1

## 2020-11-20 MED FILL — METOPROLOL TARTRATE 50 MG T: 50 | 90 days supply | Qty: 90 | Fill #0

## 2020-11-23 NOTE — Patient Instructions (Addendum)
Blood work was ordered.     Pneumovax immunization administered today.   Medications changes include :   none  Your prescription(s) have been submitted to your pharmacy.     Please followup in 6 months    Health Maintenance, Female Adopting a healthy lifestyle and getting preventive care are important in promoting health and wellness. Ask your health care provider about:  The right schedule for you to have regular tests and exams.  Things you can do on your own to prevent diseases and keep yourself healthy. What should I know about diet, weight, and exercise? Eat a healthy diet   Eat a diet that includes plenty of vegetables, fruits, low-fat dairy products, and lean protein.  Do not eat a lot of foods that are high in solid fats, added sugars, or sodium. Maintain a healthy weight Body mass index (BMI) is used to identify weight problems. It estimates body fat based on height and weight. Your health care provider can help determine your BMI and help you achieve or maintain a healthy weight. Get regular exercise Get regular exercise. This is one of the most important things you can do for your health. Most adults should:  Exercise for at least 150 minutes each week. The exercise should increase your heart rate and make you sweat (moderate-intensity exercise).  Do strengthening exercises at least twice a week. This is in addition to the moderate-intensity exercise.  Spend less time sitting. Even light physical activity can be beneficial. Watch cholesterol and blood lipids Have your blood tested for lipids and cholesterol at 66 years of age, then have this test every 5 years. Have your cholesterol levels checked more often if:  Your lipid or cholesterol levels are high.  You are older than 66 years of age.  You are at high risk for heart disease. What should I know about cancer screening? Depending on your health history and family history, you may need to have cancer  screening at various ages. This may include screening for:  Breast cancer.  Cervical cancer.  Colorectal cancer.  Skin cancer.  Lung cancer. What should I know about heart disease, diabetes, and high blood pressure? Blood pressure and heart disease  High blood pressure causes heart disease and increases the risk of stroke. This is more likely to develop in people who have high blood pressure readings, are of African descent, or are overweight.  Have your blood pressure checked: ? Every 3-5 years if you are 67-5 years of age. ? Every year if you are 60 years old or older. Diabetes Have regular diabetes screenings. This checks your fasting blood sugar level. Have the screening done:  Once every three years after age 66 if you are at a normal weight and have a low risk for diabetes.  More often and at a younger age if you are overweight or have a high risk for diabetes. What should I know about preventing infection? Hepatitis B If you have a higher risk for hepatitis B, you should be screened for this virus. Talk with your health care provider to find out if you are at risk for hepatitis B infection. Hepatitis C Testing is recommended for:  Everyone born from [birth year] through 1967.  Anyone with known risk factors for hepatitis C. Sexually transmitted infections (STIs)  Get screened for STIs, including gonorrhea and chlamydia, if: ? You are sexually active and are younger than 66 years of age. ? You are older than 66 years of age and your  health care provider tells you that you are at risk for this type of infection. ? Your sexual activity has changed since you were last screened, and you are at increased risk for chlamydia or gonorrhea. Ask your health care provider if you are at risk.  Ask your health care provider about whether you are at high risk for HIV. Your health care provider may recommend a prescription medicine to help prevent HIV infection. If you choose to take  medicine to prevent HIV, you should first get tested for HIV. You should then be tested every 3 months for as long as you are taking the medicine. Pregnancy  If you are about to stop having your period (premenopausal) and you may become pregnant, seek counseling before you get pregnant.  Take 400 to 800 micrograms (mcg) of folic acid every day if you become pregnant.  Ask for birth control (contraception) if you want to prevent pregnancy. Osteoporosis and menopause Osteoporosis is a disease in which the bones lose minerals and strength with aging. This can result in bone fractures. If you are 21 years old or older, or if you are at risk for osteoporosis and fractures, ask your health care provider if you should:  Be screened for bone loss.  Take a calcium or vitamin D supplement to lower your risk of fractures.  Be given hormone replacement therapy (HRT) to treat symptoms of menopause. Follow these instructions at home: Lifestyle  Do not use any products that contain nicotine or tobacco, such as cigarettes, e-cigarettes, and chewing tobacco. If you need help quitting, ask your health care provider.  Do not use street drugs.  Do not share needles.  Ask your health care provider for help if you need support or information about quitting drugs. Alcohol use  Do not drink alcohol if: ? Your health care provider tells you not to drink. ? You are pregnant, may be pregnant, or are planning to become pregnant.  If you drink alcohol: ? Limit how much you use to 0-1 drink a day. ? Limit intake if you are breastfeeding.  Be aware of how much alcohol is in your drink. In the [place] one drink equals one 12 oz bottle of beer (355 mL), one 5 oz glass of wine (148 mL), or one 1 oz glass of hard liquor (44 mL). General instructions  Schedule regular health, dental, and eye exams.  Stay current with your vaccines.  Tell your health care provider if: ? You often feel depressed. ? You have  ever been abused or do not feel safe at home. Summary  Adopting a healthy lifestyle and getting preventive care are important in promoting health and wellness.  Follow your health care provider's instructions about healthy diet, exercising, and getting tested or screened for diseases.  Follow your health care provider's instructions on monitoring your cholesterol and blood pressure. This information is not intended to replace advice given to you by your health care provider. Make sure you discuss any questions you have with your health care provider. Document Revised: 11/18/2018 Document Reviewed: 11/18/2018 Elsevier Patient Education  2020 [employer].

## 2020-11-23 NOTE — Progress Notes (Signed)
Subjective:    Patient ID: Yesenia Larson, female    DOB: 10-Mar-1956, 64 y.o.   MRN: [id number]   This visit occurred during the SARS-CoV-2 public health emergency.  Safety protocols were in place, including screening questions prior to the visit, additional usage of staff PPE, and extensive cleaning of exam room while observing appropriate contact time as indicated for disinfecting solutions.    HPI She is here for a physical exam.   Left ear pain - feels weird. Pain at one point.  No change in hearing.     Medications and allergies reviewed with patient and updated if appropriate.  Patient Active Problem List   Diagnosis Date Noted  . Hyperlipidemia 08/19/2018  . Diabetes mellitus without complication ([place]) 29/79/8921  . Biceps tendinitis of both shoulders 01/16/2017  . Essential hypertension 01/13/2015  . Lichen sclerosus   . H/O vitamin D deficiency   . Osteopenia   . Fibroids   . Anemia   . H/O gonorrhea   . Mild anemia   . 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 calcium carbonate 200 MG capsule Take 250 mg by mouth 2 (two) times daily with a meal.    . clotrimazole-betamethasone (LOTRISONE) cream Apply 1 application topically 2 (two) times daily. 30 g 0  . fish oil-omega-3 fatty acids 1000 MG capsule Take 2 g by mouth daily.    . Glucosamine HCl (GLUCOSAMINE PO) Take by mouth 2 (two) times daily.    . hydrochlorothiazide (HYDRODIURIL) 25 MG tablet Take 1 tablet (25 mg total) by mouth daily. 90 tablet 1  . metoprolol tartrate (LOPRESSOR) 50 MG tablet TAKE 1 TABLET BY MOUTH ONCE DAILY 90 tablet 1  . Multiple Vitamin (MULTIVITAMIN) tablet Take 1 tablet by mouth daily.    Marland Kitchen nystatin (MYCOSTATIN/NYSTOP) powder Apply topically 4 (four) times daily. 60 g 5  . RESTASIS 0.05 % ophthalmic emulsion     . rosuvastatin (CRESTOR) 10 MG tablet Take 1 tablet (10 mg total) by mouth daily. 90 tablet 1   . FLUAD QUADRIVALENT 0.5 ML injection      No current facility-administered medications on file prior to visit.    Past Medical History:  Diagnosis Date  . Allergy   . Anemia    H/O  . Arthritis   . Candidiasis    H/O  . Fibroids   . H/O gonorrhea   . H/O vitamin D deficiency   . Hx of amenorrhea   . Hyperlipidemia   . Hypertension   . Irregular menses    H/O  . Lichen sclerosus   . Mild anemia   . Osteopenia   . Pelvic relaxation     Past Surgical History:  Procedure Laterality Date  . ABDOMINAL HYSTERECTOMY    . COLONOSCOPY    . DILATION AND EVACUATION  1994  . TONSILLECTOMY    . VULVA /PERINEUM BIOPSY  02/2008    Social History   Socioeconomic History  . Marital status: Married    Spouse name: Not on file  . Number of children: Not on file  . Years of education: Not on file  . Highest education level: Not on file  Occupational History  . Not on file  Tobacco Use  . Smoking status: Never Smoker  . Smokeless tobacco: Never Used  Vaping Use  . Vaping Use: Never used  Substance and Sexual Activity  . Alcohol use: No  .  Drug use: No  . Sexual activity: Yes    Birth control/protection: Post-menopausal  Other Topics Concern  . Not on file  Social History Narrative  . Not on file   Social Determinants of Health   Financial Resource Strain: Not on file  Food Insecurity: Not on file  Transportation Needs: Not on file  Physical Activity: Not on file  Stress: Not on file  Social Connections: Not on file    Family History  Problem Relation Age of Onset  . Colon polyps Sister   . Diabetes Sister   . Diabetes Brother   . Diabetes Sister   . Colon cancer Neg Hx   . Rectal cancer Neg Hx   . Stomach cancer Neg Hx   . Esophageal cancer Neg Hx     Review of Systems  Constitutional: Negative for chills and fever.  Eyes: Negative for visual disturbance.  Respiratory: Negative for cough, shortness of breath and wheezing.   Cardiovascular: Negative  for chest pain, palpitations and leg swelling.  Gastrointestinal: Negative for abdominal pain, blood in stool, constipation, diarrhea and nausea.       No gerd  Genitourinary: Negative for dysuria.  Musculoskeletal: Positive for arthralgias (hand stiffness). Negative for back pain.  Skin: Negative for color change and rash.  Neurological: Positive for numbness (hands at night). Negative for dizziness, light-headedness and headaches.  Psychiatric/Behavioral: Negative for dysphoric mood. The patient is not nervous/anxious.        Objective:   Vitals:   11/24/20 0904  BP: 130/78  Pulse: 63  Temp: 98.6 F (37 C)  SpO2: 98%   Filed Weights   11/24/20 0904  Weight: 170 lb 6.4 oz (77.3 kg)   Body mass index is 30.19 kg/m.  BP Readings from Last 3 Encounters:  11/24/20 130/78  05/24/20 130/72  11/24/19 (!) 152/80    Wt Readings from Last 3 Encounters:  11/24/20 170 lb 6.4 oz (77.3 kg)  05/24/20 169 lb (76.7 kg)  11/24/19 171 lb 12.8 oz (77.9 kg)    Depression screen [facility] 2/9 11/24/2020 11/24/2019 11/13/2018 10/16/2018 05/05/2018  Decreased Interest 0 0 0 0 0  Down, Depressed, Hopeless 0 0 0 0 0  PHQ - 2 Score 0 0 0 0 0  Altered sleeping 0 - - - 0  Tired, decreased energy 0 - - - 0  Change in appetite 0 - - - 0  Feeling bad or failure about yourself  0 - - - 0  Trouble concentrating 0 - - - 0  Moving slowly or fidgety/restless 0 - - - 0  Suicidal thoughts 0 - - - 0  PHQ-9 Score 0 - - - 0  Difficult doing work/chores Not difficult at all - - - -      Physical Exam Constitutional: She appears well-developed and well-nourished. No distress.  HENT:  Head: Normocephalic and atraumatic.  Right Ear: External ear normal. Normal ear canal and TM Left Ear: External ear normal.  Normal ear canal and TM Mouth/Throat: Oropharynx is clear and moist.  Eyes: Conjunctivae and EOM are normal.  Neck: Neck supple. No tracheal deviation present. No thyromegaly present.  No carotid  bruit  Cardiovascular: Normal rate, regular rhythm and normal heart sounds.   No murmur heard.  No edema. Pulmonary/Chest: Effort normal and breath sounds normal. No respiratory distress. She has no wheezes. She has no rales.  Breast: deferred   Abdominal: Soft. She exhibits no distension. There is no tenderness.  Lymphadenopathy: She has  no cervical adenopathy.  Skin: Skin is warm and dry. She is not diaphoretic.  Psychiatric: She has a normal mood and affect. Her behavior is normal.        Assessment & Plan:   Physical exam: Screening blood work    ordered Immunizations  Covid booster advised, flu done, pneumovax due, discussed shingrix Colonoscopy  Up to date  Mammogram  Up to date - October 10/21 - [facility] [place] [facility]  Up to date  Dexa  Up to date  Eye exams  scheduled for next week Exercise   - not really Weight  Advised weight loss Substance abuse  none   Screened for depression using the PHQ 9 scale.  No evidence of depression.     See Problem List for Assessment and Plan of chronic medical problems.

## 2020-11-24 ENCOUNTER — Encounter: Payer: Self-pay | Admitting: Internal Medicine

## 2020-11-24 ENCOUNTER — Other Ambulatory Visit: Payer: Self-pay

## 2020-11-24 ENCOUNTER — Ambulatory Visit (INDEPENDENT_AMBULATORY_CARE_PROVIDER_SITE_OTHER): Payer: 59 | Admitting: Internal Medicine

## 2020-11-24 ENCOUNTER — Other Ambulatory Visit: Payer: Self-pay | Admitting: Internal Medicine

## 2020-11-24 VITALS — BP 130/78 | HR 63 | Temp 98.6°F | Ht 63.0 in | Wt 170.4 lb

## 2020-11-24 DIAGNOSIS — E119 Type 2 diabetes mellitus without complications: Secondary | ICD-10-CM | POA: Diagnosis not present

## 2020-11-24 DIAGNOSIS — E785 Hyperlipidemia, unspecified: Secondary | ICD-10-CM | POA: Diagnosis not present

## 2020-11-24 DIAGNOSIS — Z23 Encounter for immunization: Secondary | ICD-10-CM | POA: Diagnosis not present

## 2020-11-24 DIAGNOSIS — E782 Mixed hyperlipidemia: Secondary | ICD-10-CM | POA: Diagnosis not present

## 2020-11-24 DIAGNOSIS — Z0000 Encounter for general adult medical examination without abnormal findings: Secondary | ICD-10-CM | POA: Diagnosis not present

## 2020-11-24 DIAGNOSIS — M8589 Other specified disorders of bone density and structure, multiple sites: Secondary | ICD-10-CM

## 2020-11-24 DIAGNOSIS — I10 Essential (primary) hypertension: Secondary | ICD-10-CM | POA: Diagnosis not present

## 2020-11-24 LAB — COMPREHENSIVE METABOLIC PANEL
ALT: 46 U/L — ABNORMAL HIGH (ref 0–35)
AST: 38 U/L — ABNORMAL HIGH (ref 0–37)
Albumin: 3.8 g/dL (ref 3.5–5.2)
Alkaline Phosphatase: 66 U/L (ref 39–117)
BUN: 15 mg/dL (ref 6–23)
CO2: 30 mEq/L (ref 19–32)
Calcium: 9.4 mg/dL (ref 8.4–10.5)
Chloride: 102 mEq/L (ref 96–112)
Creatinine, Ser: 0.7 mg/dL (ref 0.40–1.20)
GFR: 89.97 mL/min (ref >60.00)
Glucose, Bld: 131 mg/dL — ABNORMAL HIGH (ref 70–99)
Potassium: 3.5 mEq/L (ref 3.5–5.1)
Sodium: 139 mEq/L (ref 135–145)
Total Bilirubin: 0.3 mg/dL (ref 0.2–1.2)
Total Protein: 7.2 g/dL (ref 6.0–8.3)

## 2020-11-24 LAB — CBC WITH DIFFERENTIAL/PLATELET
Basophils Absolute: 0.1 10*3/uL (ref 0.0–0.1)
Basophils Relative: 0.9 % (ref 0.0–3.0)
Eosinophils Absolute: 0.2 10*3/uL (ref 0.0–0.7)
Eosinophils Relative: 3 % (ref 0.0–5.0)
HCT: 36.6 % (ref 36.0–46.0)
Hemoglobin: 12.1 g/dL (ref 12.0–15.0)
Lymphocytes Relative: 43 % (ref 12.0–46.0)
Lymphs Abs: 3.2 10*3/uL (ref 0.7–4.0)
MCHC: 33 g/dL (ref 30.0–36.0)
MCV: 89.1 fl (ref 78.0–100.0)
Monocytes Absolute: 0.8 10*3/uL (ref 0.1–1.0)
Monocytes Relative: 10.7 % (ref 3.0–12.0)
Neutro Abs: 3.1 10*3/uL (ref 1.4–7.7)
Neutrophils Relative %: 42.4 % — ABNORMAL LOW (ref 43.0–77.0)
Platelets: 280 10*3/uL (ref 150.0–400.0)
RBC: 4.11 Mil/uL (ref 3.87–5.11)
RDW: 13.9 % (ref 11.5–15.5)
WBC: 7.4 10*3/uL (ref 4.0–10.5)

## 2020-11-24 LAB — LIPID PANEL
Cholesterol: 145 mg/dL (ref 0–200)
HDL: 43.5 mg/dL (ref >39.00)
LDL Cholesterol: 86 mg/dL (ref 0–99)
NonHDL: 101.36
Total CHOL/HDL Ratio: 3
Triglycerides: 77 mg/dL (ref 0.0–149.0)
VLDL: 15.4 mg/dL (ref 0.0–40.0)

## 2020-11-24 LAB — HEMOGLOBIN A1C: Hgb A1c MFr Bld: 6.8 % — ABNORMAL HIGH (ref 4.6–6.5)

## 2020-11-24 LAB — MICROALBUMIN / CREATININE URINE RATIO
Creatinine,U: 238.3 mg/dL
Microalb Creat Ratio: 0.6 mg/g (ref 0.0–30.0)
Microalb, Ur: 1.5 mg/dL (ref 0.0–1.9)

## 2020-11-24 LAB — TSH: TSH: 2.3 u[IU]/mL (ref 0.35–4.50)

## 2020-11-24 MED ORDER — ROSUVASTATIN CALCIUM 10 MG PO TABS: 10.0000 mg | ORAL_TABLET | Freq: Every day | ORAL | 1 refills | Status: DC

## 2020-11-24 MED ORDER — HYDROCHLOROTHIAZIDE 25 MG PO TABS: 25.0000 mg | ORAL_TABLET | Freq: Every day | ORAL | 1 refills | Status: DC

## 2020-11-24 MED ORDER — FEXOFENADINE HCL 180 MG PO TABS: 180.0000 mg | ORAL_TABLET | Freq: Every day | ORAL | 3 refills | Status: DC

## 2020-11-24 MED FILL — HYDROCHLOROTHIAZIDE 25 MG T: 25 | 90 days supply | Qty: 90 | Fill #0

## 2020-11-24 MED FILL — SM FEXOFENADINE 180 MG TAB: 180 MG | 90 days supply | Qty: 90 | Fill #0

## 2020-11-24 NOTE — Assessment & Plan Note (Signed)
Chronic Diet controlled Check a1c, urine micro Low sugar / carb diet Stressed regular exercise advised weight loss

## 2020-11-24 NOTE — Assessment & Plan Note (Signed)
Chronic dexa up to date - Done by gyn Stressed regular exercise Taking calcium / vitamin d

## 2020-11-24 NOTE — Assessment & Plan Note (Signed)
Chronic Check lipid panel  Continue crestor 10 mg daily Regular exercise and healthy diet encouraged  

## 2020-11-24 NOTE — Assessment & Plan Note (Signed)
Chronic BP well controlled Continue hctz 25 mg daily, metoprolol 50 mg daily cmp

## 2020-11-29 ENCOUNTER — Other Ambulatory Visit (HOSPITAL_COMMUNITY): Payer: Self-pay | Admitting: Ophthalmology

## 2020-11-29 DIAGNOSIS — H2513 Age-related nuclear cataract, bilateral: Secondary | ICD-10-CM | POA: Diagnosis not present

## 2020-11-29 DIAGNOSIS — H52223 Regular astigmatism, bilateral: Secondary | ICD-10-CM | POA: Diagnosis not present

## 2020-11-29 DIAGNOSIS — H16223 Keratoconjunctivitis sicca, not specified as Sjogren's, bilateral: Secondary | ICD-10-CM | POA: Diagnosis not present

## 2020-11-29 DIAGNOSIS — H35363 Drusen (degenerative) of macula, bilateral: Secondary | ICD-10-CM | POA: Diagnosis not present

## 2020-11-29 DIAGNOSIS — H5203 Hypermetropia, bilateral: Secondary | ICD-10-CM | POA: Diagnosis not present

## 2020-11-29 DIAGNOSIS — H43813 Vitreous degeneration, bilateral: Secondary | ICD-10-CM | POA: Diagnosis not present

## 2020-11-29 DIAGNOSIS — H524 Presbyopia: Secondary | ICD-10-CM | POA: Diagnosis not present

## 2020-11-29 MED FILL — RESTASIS 0.05% EYE EMULSION: 0.05 | 90 days supply | Qty: 180 | Fill #0

## 2021-02-27 ENCOUNTER — Other Ambulatory Visit (HOSPITAL_BASED_OUTPATIENT_CLINIC_OR_DEPARTMENT_OTHER): Payer: Self-pay

## 2021-03-06 ENCOUNTER — Other Ambulatory Visit (HOSPITAL_BASED_OUTPATIENT_CLINIC_OR_DEPARTMENT_OTHER): Payer: Self-pay

## 2021-05-18 ENCOUNTER — Other Ambulatory Visit (HOSPITAL_COMMUNITY): Payer: Self-pay

## 2021-05-18 ENCOUNTER — Other Ambulatory Visit: Payer: Self-pay | Admitting: Internal Medicine

## 2021-05-18 DIAGNOSIS — I10 Essential (primary) hypertension: Secondary | ICD-10-CM

## 2021-05-18 MED ORDER — HYDROCHLOROTHIAZIDE 25 MG PO TABS
ORAL_TABLET | Freq: Every day | ORAL | 1 refills | Status: DC
  Filled 2021-05-18: qty 90, 90d supply, fill #0
  Filled 2021-08-29: qty 90, 90d supply, fill #1

## 2021-05-18 MED ORDER — METOPROLOL TARTRATE 50 MG PO TABS
ORAL_TABLET | Freq: Every day | ORAL | 1 refills | Status: DC
  Filled 2021-05-18: qty 90, 90d supply, fill #0
  Filled 2021-08-29: qty 90, 90d supply, fill #1

## 2021-05-18 MED FILL — Rosuvastatin Calcium Tab 10 MG: ORAL | 90 days supply | Qty: 90 | Fill #0 | Status: AC

## 2021-05-18 MED FILL — Fexofenadine HCl Tab 180 MG: ORAL | 90 days supply | Qty: 90 | Fill #0 | Status: AC

## 2021-05-18 MED FILL — Cyclosporine (Ophth) Emulsion 0.05%: OPHTHALMIC | 90 days supply | Qty: 180 | Fill #0 | Status: AC

## 2021-08-29 ENCOUNTER — Other Ambulatory Visit (HOSPITAL_COMMUNITY): Payer: Self-pay

## 2021-09-03 ENCOUNTER — Encounter: Payer: Self-pay | Admitting: Internal Medicine

## 2021-10-10 DIAGNOSIS — Z1231 Encounter for screening mammogram for malignant neoplasm of breast: Secondary | ICD-10-CM | POA: Diagnosis not present

## 2021-10-10 DIAGNOSIS — I10 Essential (primary) hypertension: Secondary | ICD-10-CM | POA: Diagnosis not present

## 2021-10-10 DIAGNOSIS — Z01411 Encounter for gynecological examination (general) (routine) with abnormal findings: Secondary | ICD-10-CM | POA: Diagnosis not present

## 2021-10-10 DIAGNOSIS — Z6829 Body mass index (BMI) 29.0-29.9, adult: Secondary | ICD-10-CM | POA: Diagnosis not present

## 2021-10-10 DIAGNOSIS — M8580 Other specified disorders of bone density and structure, unspecified site: Secondary | ICD-10-CM | POA: Diagnosis not present

## 2021-10-12 LAB — HM MAMMOGRAPHY

## 2021-12-07 ENCOUNTER — Other Ambulatory Visit: Payer: Self-pay | Admitting: Internal Medicine

## 2021-12-07 ENCOUNTER — Other Ambulatory Visit (HOSPITAL_COMMUNITY): Payer: Self-pay

## 2021-12-07 DIAGNOSIS — I10 Essential (primary) hypertension: Secondary | ICD-10-CM

## 2021-12-09 MED ORDER — HYDROCHLOROTHIAZIDE 25 MG PO TABS
ORAL_TABLET | Freq: Every day | ORAL | 0 refills | Status: DC
  Filled 2021-12-09: qty 30, 30d supply, fill #0

## 2021-12-09 MED ORDER — METOPROLOL TARTRATE 50 MG PO TABS
50.0000 mg | ORAL_TABLET | Freq: Every day | ORAL | 0 refills | Status: DC
  Filled 2021-12-09: qty 30, 30d supply, fill #0

## 2021-12-10 ENCOUNTER — Other Ambulatory Visit (HOSPITAL_COMMUNITY): Payer: Self-pay

## 2022-01-10 ENCOUNTER — Encounter: Payer: Self-pay | Admitting: Internal Medicine

## 2022-01-10 NOTE — Patient Instructions (Addendum)
° °  Shingles vaccine given   Blood work was ordered.       Medications changes include :   none     Please followup in 6 month

## 2022-01-10 NOTE — Progress Notes (Signed)
Subjective:    Patient ID: Yesenia Larson, female    DOB: 05/21/1956, 66 y.o.   MRN: [id number]  This visit occurred during the SARS-CoV-2 public health emergency.  Safety protocols were in place, including screening questions prior to the visit, additional usage of staff PPE, and extensive cleaning of exam room while observing appropriate contact time as indicated for disinfecting solutions.     HPI The patient is here for follow up of their chronic medical problems, including htn, hld, DM, osteopenia  She is not exercising regularly.   She knows she may not have been eating as best as she could.  She is taking her medication as prescribed.  Medications and allergies reviewed with patient and updated if appropriate.  Patient Active Problem List   Diagnosis Date Noted   Hyperlipidemia 08/19/2018   Diabetes mellitus without complication ([place]) 66/05/3015   Biceps tendinitis of both shoulders 01/16/2017   Essential hypertension 12/17/3233   Lichen sclerosus    H/O vitamin D deficiency    Osteopenia    Fibroids    Anemia    H/O gonorrhea    Mild anemia    DEQUERVAIN'S 12/05/2009    Current Outpatient Medications on File Prior to Visit  Medication Sig Dispense Refill   calcium carbonate 200 MG capsule Take 250 mg by mouth 2 (two) times daily with a meal.     clotrimazole-betamethasone (LOTRISONE) cream Apply 1 application topically 2 (two) times daily. 30 g 0   fexofenadine (ALLEGRA) 180 MG tablet TAKE 1 TABLET BY MOUTH ONCE A DAY 90 tablet 3   fish oil-omega-3 fatty acids 1000 MG capsule Take 2 g by mouth daily.     Glucosamine HCl (GLUCOSAMINE PO) Take by mouth 2 (two) times daily.     hydrochlorothiazide (HYDRODIURIL) 25 MG tablet TAKE 1 TABLET BY MOUTH DAILY. 30 tablet 0   metoprolol tartrate (LOPRESSOR) 50 MG tablet Take 1 tablet (50 mg total) by mouth daily. **Overdue for follow up 30 tablet 0   Multiple Vitamin (MULTIVITAMIN) tablet Take 1 tablet by mouth daily.      nystatin (MYCOSTATIN/NYSTOP) powder Apply topically 4 (four) times daily. 60 g 5   RESTASIS 0.05 % ophthalmic emulsion      rosuvastatin (CRESTOR) 10 MG tablet TAKE 1 TABLET BY MOUTH DAILY. 90 tablet 1   No current facility-administered medications on file prior to visit.    Past Medical History:  Diagnosis Date   Allergy    Anemia    H/O   Arthritis    Candidiasis    H/O   Fibroids    H/O gonorrhea    H/O vitamin D deficiency    Hx of amenorrhea    Hyperlipidemia    Hypertension    Irregular menses    H/O   Lichen sclerosus    Mild anemia    Osteopenia    Pelvic relaxation     Past Surgical History:  Procedure Laterality Date   ABDOMINAL HYSTERECTOMY     COLONOSCOPY     DILATION AND EVACUATION  1994   TONSILLECTOMY     VULVA /PERINEUM BIOPSY  02/2008    Social History   Socioeconomic History   Marital status: Married    Spouse name: Not on file   Number of children: Not on file   Years of education: Not on file   Highest education level: Not on file  Occupational History   Not on file  Tobacco Use  Smoking status: Never   Smokeless tobacco: Never  Vaping Use   Vaping Use: Never used  Substance and Sexual Activity   Alcohol use: No   Drug use: No   Sexual activity: Yes    Birth control/protection: Post-menopausal  Other Topics Concern   Not on file  Social History Narrative   Not on file   Social Determinants of Health   Financial Resource Strain: Not on file  Food Insecurity: Not on file  Transportation Needs: Not on file  Physical Activity: Not on file  Stress: Not on file  Social Connections: Not on file    Family History  Problem Relation Age of Onset   Colon polyps Sister    Diabetes Sister    Diabetes Brother    Diabetes Sister    Colon cancer Neg Hx    Rectal cancer Neg Hx    Stomach cancer Neg Hx    Esophageal cancer Neg Hx     Review of Systems  Constitutional:  Negative for chills and fever.  Respiratory:  Negative  for cough, shortness of breath and wheezing.   Cardiovascular:  Negative for chest pain, palpitations and leg swelling.  Neurological:  Positive for headaches (occ). Negative for dizziness and light-headedness.      Objective:  There were no vitals filed for this visit. BP Readings from Last 3 Encounters:  11/24/20 130/78  05/24/20 130/72  11/24/19 (!) 152/80   Wt Readings from Last 3 Encounters:  11/24/20 170 lb 6.4 oz (77.3 kg)  05/24/20 169 lb (76.7 kg)  11/24/19 171 lb 12.8 oz (77.9 kg)   There is no height or weight on file to calculate BMI.   Physical Exam    Constitutional: Appears well-developed and well-nourished. No distress.  HENT:  Head: Normocephalic and atraumatic.  Neck: Neck supple. No tracheal deviation present. No thyromegaly present.  No cervical lymphadenopathy Cardiovascular: Normal rate, regular rhythm and normal heart sounds.   No murmur heard. No carotid bruit .  No edema Pulmonary/Chest: Effort normal and breath sounds normal. No respiratory distress. No has no wheezes. No rales.  Skin: Skin is warm and dry. Not diaphoretic.  Psychiatric: Normal mood and affect. Behavior is normal.      Assessment & Plan:    Shingles vaccine #1 today  See Problem List for Assessment and Plan of chronic medical problems.

## 2022-01-11 ENCOUNTER — Other Ambulatory Visit (HOSPITAL_COMMUNITY): Payer: Self-pay

## 2022-01-11 ENCOUNTER — Other Ambulatory Visit: Payer: Self-pay

## 2022-01-11 ENCOUNTER — Telehealth: Payer: Self-pay

## 2022-01-11 ENCOUNTER — Ambulatory Visit: Payer: 59 | Admitting: Internal Medicine

## 2022-01-11 VITALS — BP 130/72 | HR 71 | Temp 98.5°F | Ht 63.0 in | Wt 166.8 lb

## 2022-01-11 DIAGNOSIS — I10 Essential (primary) hypertension: Secondary | ICD-10-CM

## 2022-01-11 DIAGNOSIS — Z23 Encounter for immunization: Secondary | ICD-10-CM | POA: Diagnosis not present

## 2022-01-11 DIAGNOSIS — E119 Type 2 diabetes mellitus without complications: Secondary | ICD-10-CM

## 2022-01-11 DIAGNOSIS — M8589 Other specified disorders of bone density and structure, multiple sites: Secondary | ICD-10-CM | POA: Diagnosis not present

## 2022-01-11 DIAGNOSIS — E782 Mixed hyperlipidemia: Secondary | ICD-10-CM

## 2022-01-11 LAB — COMPREHENSIVE METABOLIC PANEL
ALT: 32 U/L (ref 0–35)
AST: 29 U/L (ref 0–37)
Albumin: 4.2 g/dL (ref 3.5–5.2)
Alkaline Phosphatase: 70 U/L (ref 39–117)
BUN: 12 mg/dL (ref 6–23)
CO2: 35 mEq/L — ABNORMAL HIGH (ref 19–32)
Calcium: 10.4 mg/dL (ref 8.4–10.5)
Chloride: 98 mEq/L (ref 96–112)
Creatinine, Ser: 0.74 mg/dL (ref 0.40–1.20)
GFR: 83.5 mL/min (ref >60.00)
Glucose, Bld: 123 mg/dL — ABNORMAL HIGH (ref 70–99)
Potassium: 3.8 mEq/L (ref 3.5–5.1)
Sodium: 137 mEq/L (ref 135–145)
Total Bilirubin: 0.6 mg/dL (ref 0.2–1.2)
Total Protein: 7.7 g/dL (ref 6.0–8.3)

## 2022-01-11 LAB — LIPID PANEL
Cholesterol: 187 mg/dL (ref 0–200)
HDL: 43.9 mg/dL (ref >39.00)
LDL Cholesterol: 122 mg/dL — ABNORMAL HIGH (ref 0–99)
NonHDL: 143.45
Total CHOL/HDL Ratio: 4
Triglycerides: 105 mg/dL (ref 0.0–149.0)
VLDL: 21 mg/dL (ref 0.0–40.0)

## 2022-01-11 LAB — MICROALBUMIN / CREATININE URINE RATIO
Creatinine,U: 113.3 mg/dL
Microalb Creat Ratio: 0.8 mg/g (ref 0.0–30.0)
Microalb, Ur: 0.9 mg/dL (ref 0.0–1.9)

## 2022-01-11 LAB — HEMOGLOBIN A1C: Hgb A1c MFr Bld: 6.7 % — ABNORMAL HIGH (ref 4.6–6.5)

## 2022-01-11 MED ORDER — HYDROCHLOROTHIAZIDE 25 MG PO TABS
25.0000 mg | ORAL_TABLET | Freq: Every day | ORAL | 1 refills | Status: DC
  Filled 2022-01-11: qty 90, 90d supply, fill #0

## 2022-01-11 MED ORDER — METOPROLOL TARTRATE 50 MG PO TABS
50.0000 mg | ORAL_TABLET | Freq: Every day | ORAL | 1 refills | Status: DC
  Filled 2022-01-11: qty 90, 90d supply, fill #0

## 2022-01-11 MED ORDER — HYDROCHLOROTHIAZIDE 25 MG PO TABS
25.0000 mg | ORAL_TABLET | Freq: Every day | ORAL | 1 refills | Status: DC
  Filled 2022-01-11 – 2022-01-18 (×2): qty 90, 90d supply, fill #0
  Filled 2022-06-10: qty 90, 90d supply, fill #1

## 2022-01-11 MED ORDER — METOPROLOL TARTRATE 50 MG PO TABS
50.0000 mg | ORAL_TABLET | Freq: Every day | ORAL | 1 refills | Status: DC
  Filled 2022-01-11 – 2022-01-18 (×2): qty 90, 90d supply, fill #0
  Filled 2022-04-10: qty 90, 90d supply, fill #1

## 2022-01-11 NOTE — Telephone Encounter (Signed)
Pt calling in requesting the refill for: metoprolol tartrate (LOPRESSOR) 50 MG tablet hydrochlorothiazide (HYDRODIURIL) 25 MG tablet  Pharmacy: [facility]

## 2022-01-11 NOTE — Assessment & Plan Note (Signed)
Chronic Blood pressure well controlled CMP Continue hydrochlorothiazide 25 mg daily, metoprolol 50 mg daily 

## 2022-01-11 NOTE — Telephone Encounter (Signed)
Faxed to [facility] today. Scripts previously sent to [facility].

## 2022-01-11 NOTE — Assessment & Plan Note (Signed)
Chronic Regular exercise and healthy diet encouraged Check lipid panel  Continue rosuvastatin 10 mg daily 

## 2022-01-11 NOTE — Assessment & Plan Note (Addendum)
Chronic DEXA up-to-date annual GYN-we will request records Encouraged regular exercise

## 2022-01-11 NOTE — Assessment & Plan Note (Signed)
Chronic Diet controlled Check A1c, urine microalbumin Stressed diabetic diet and regular exercise

## 2022-01-18 ENCOUNTER — Other Ambulatory Visit (HOSPITAL_COMMUNITY): Payer: Self-pay

## 2022-01-24 ENCOUNTER — Telehealth: Payer: Self-pay

## 2022-01-24 NOTE — Telephone Encounter (Signed)
Request faxed today

## 2022-01-24 NOTE — Telephone Encounter (Signed)
-----   Message from Binnie Rail, MD sent at 01/11/2022 12:23 PM EST ----- Can you please request her mammogram, DEXA, from her GYN--Dr. Alesia Richards.  Thank you.

## 2022-03-15 ENCOUNTER — Other Ambulatory Visit (HOSPITAL_COMMUNITY): Payer: Self-pay

## 2022-04-10 ENCOUNTER — Other Ambulatory Visit (HOSPITAL_COMMUNITY): Payer: Self-pay

## 2022-06-10 ENCOUNTER — Other Ambulatory Visit (HOSPITAL_COMMUNITY): Payer: Self-pay

## 2022-06-10 ENCOUNTER — Other Ambulatory Visit: Payer: Self-pay | Admitting: Internal Medicine

## 2022-06-10 DIAGNOSIS — E785 Hyperlipidemia, unspecified: Secondary | ICD-10-CM

## 2022-06-10 MED ORDER — FEXOFENADINE HCL 180 MG PO TABS
ORAL_TABLET | Freq: Every day | ORAL | 3 refills | Status: DC
  Filled 2022-06-10: qty 90, 90d supply, fill #0
  Filled 2022-10-27 – 2022-10-28 (×2): qty 90, 90d supply, fill #1
  Filled 2023-04-24: qty 90, 90d supply, fill #2
  Filled 2023-06-02: qty 90, 90d supply, fill #3

## 2022-06-10 MED ORDER — ROSUVASTATIN CALCIUM 10 MG PO TABS
ORAL_TABLET | Freq: Every day | ORAL | 1 refills | Status: DC
  Filled 2022-06-10: qty 90, 90d supply, fill #0

## 2022-06-19 ENCOUNTER — Other Ambulatory Visit (HOSPITAL_COMMUNITY): Payer: Self-pay

## 2022-07-11 ENCOUNTER — Encounter: Payer: Self-pay | Admitting: Internal Medicine

## 2022-07-11 NOTE — Patient Instructions (Addendum)
Shingles vaccine #2 given   Blood work was ordered.     Medications changes include :   None   Your prescription(s) have been sent to your pharmacy.     Return in about 6 months (around 01/12/2023) for follow up.   Health Maintenance, Female Adopting a healthy lifestyle and getting preventive care are important in promoting health and wellness. Ask your health care provider about: The right schedule for you to have regular tests and exams. Things you can do on your own to prevent diseases and keep yourself healthy. What should I know about diet, weight, and exercise? Eat a healthy diet  Eat a diet that includes plenty of vegetables, fruits, low-fat dairy products, and lean protein. Do not eat a lot of foods that are high in solid fats, added sugars, or sodium. Maintain a healthy weight Body mass index (BMI) is used to identify weight problems. It estimates body fat based on height and weight. Your health care provider can help determine your BMI and help you achieve or maintain a healthy weight. Get regular exercise Get regular exercise. This is one of the most important things you can do for your health. Most adults should: Exercise for at least 150 minutes each week. The exercise should increase your heart rate and make you sweat (moderate-intensity exercise). Do strengthening exercises at least twice a week. This is in addition to the moderate-intensity exercise. Spend less time sitting. Even light physical activity can be beneficial. Watch cholesterol and blood lipids Have your blood tested for lipids and cholesterol at 68 years of age, then have this test every 5 years. Have your cholesterol levels checked more often if: Your lipid or cholesterol levels are high. You are older than 68 years of age. You are at high risk for heart disease. What should I know about cancer screening? Depending on your health history and family history, you may need to have cancer screening  at various ages. This may include screening for: Breast cancer. Cervical cancer. Colorectal cancer. Skin cancer. Lung cancer. What should I know about heart disease, diabetes, and high blood pressure? Blood pressure and heart disease High blood pressure causes heart disease and increases the risk of stroke. This is more likely to develop in people who have high blood pressure readings or are overweight. Have your blood pressure checked: Every 3-5 years if you are 60-104 years of age. Every year if you are 13 years old or older. Diabetes Have regular diabetes screenings. This checks your fasting blood sugar level. Have the screening done: Once every three years after age 46 if you are at a normal weight and have a low risk for diabetes. More often and at a younger age if you are overweight or have a high risk for diabetes. What should I know about preventing infection? Hepatitis B If you have a higher risk for hepatitis B, you should be screened for this virus. Talk with your health care provider to find out if you are at risk for hepatitis B infection. Hepatitis C Testing is recommended for: Everyone born from [birth year] through 1967. Anyone with known risk factors for hepatitis C. Sexually transmitted infections (STIs) Get screened for STIs, including gonorrhea and chlamydia, if: You are sexually active and are younger than 68 years of age. You are older than 68 years of age and your health care provider tells you that you are at risk for this type of infection. Your sexual activity has changed since you were  last screened, and you are at increased risk for chlamydia or gonorrhea. Ask your health care provider if you are at risk. Ask your health care provider about whether you are at high risk for HIV. Your health care provider may recommend a prescription medicine to help prevent HIV infection. If you choose to take medicine to prevent HIV, you should first get tested for HIV. You should then  be tested every 3 months for as long as you are taking the medicine. Pregnancy If you are about to stop having your period (premenopausal) and you may become pregnant, seek counseling before you get pregnant. Take 400 to 800 micrograms (mcg) of folic acid every day if you become pregnant. Ask for birth control (contraception) if you want to prevent pregnancy. Osteoporosis and menopause Osteoporosis is a disease in which the bones lose minerals and strength with aging. This can result in bone fractures. If you are 33 years old or older, or if you are at risk for osteoporosis and fractures, ask your health care provider if you should: Be screened for bone loss. Take a calcium or vitamin D supplement to lower your risk of fractures. Be given hormone replacement therapy (HRT) to treat symptoms of menopause. Follow these instructions at home: Alcohol use Do not drink alcohol if: Your health care provider tells you not to drink. You are pregnant, may be pregnant, or are planning to become pregnant. If you drink alcohol: Limit how much you have to: 0-1 drink a day. Know how much alcohol is in your drink. In the [place] one drink equals one 12 oz bottle of beer (355 mL), one 5 oz glass of wine (148 mL), or one 1 oz glass of hard liquor (44 mL). Lifestyle Do not use any products that contain nicotine or tobacco. These products include cigarettes, chewing tobacco, and vaping devices, such as e-cigarettes. If you need help quitting, ask your health care provider. Do not use street drugs. Do not share needles. Ask your health care provider for help if you need support or information about quitting drugs. General instructions Schedule regular health, dental, and eye exams. Stay current with your vaccines. Tell your health care provider if: You often feel depressed. You have ever been abused or do not feel safe at home. Summary Adopting a healthy lifestyle and getting preventive care are important in  promoting health and wellness. Follow your health care provider's instructions about healthy diet, exercising, and getting tested or screened for diseases. Follow your health care provider's instructions on monitoring your cholesterol and blood pressure. This information is not intended to replace advice given to you by your health care provider. Make sure you discuss any questions you have with your health care provider. Document Revised: 04/16/2021 Document Reviewed: 04/16/2021 Elsevier Patient Education  [place].

## 2022-07-11 NOTE — Progress Notes (Signed)
Subjective:    Patient ID: Yesenia Larson, female    DOB: 1956/05/08, 66 y.o.   MRN: [id number]      HPI Yesenia Larson is here for a Physical exam.    No changes in health.  No concerns.     Medications and allergies reviewed with patient and updated if appropriate.  Current Outpatient Medications on File Prior to Visit  Medication Sig Dispense Refill   calcium carbonate 200 MG capsule Take 250 mg by mouth 2 (two) times daily with a meal.     clotrimazole-betamethasone (LOTRISONE) cream Apply 1 application topically 2 (two) times daily. 30 g 0   cycloSPORINE (RESTASIS) 0.05 % ophthalmic emulsion Place 1 drop into both eyes 2 (two) times daily.     fexofenadine (SM FEXOFENADINE HCL) 180 MG tablet TAKE 1 TABLET BY MOUTH ONCE A DAY 90 tablet 3   fish oil-omega-3 fatty acids 1000 MG capsule Take 2 g by mouth daily.     Fluocin-Hydroquinone-Tretinoin (TRI-LUMA) 0.01-4-0.05 % CREA      Glucosamine HCl (GLUCOSAMINE PO) Take by mouth 2 (two) times daily.     hydrochlorothiazide (HYDRODIURIL) 25 MG tablet Take 1 tablet (25 mg total) by mouth daily. 90 tablet 1   metoprolol tartrate (LOPRESSOR) 50 MG tablet Take 1 tablet (50 mg total) by mouth daily. 90 tablet 1   Multiple Vitamin (MULTIVITAMIN) tablet Take 1 tablet by mouth daily.     nystatin (MYCOSTATIN/NYSTOP) powder Apply topically 4 (four) times daily. 60 g 5   nystatin cream (MYCOSTATIN) APPLY TO THE AFFECTED AREA(S) BY TOPICAL ROUTE 2 TIMES PER DAY     RESTASIS 0.05 % ophthalmic emulsion      rosuvastatin (CRESTOR) 10 MG tablet TAKE 1 TABLET BY MOUTH DAILY. 90 tablet 1   No current facility-administered medications on file prior to visit.    Review of Systems  Constitutional:  Negative for fever.  Eyes:  Negative for visual disturbance.  Respiratory:  Negative for cough, shortness of breath and wheezing.   Cardiovascular:  Negative for chest pain, palpitations and leg swelling.  Gastrointestinal:  Positive for constipation.  Negative for abdominal pain, blood in stool, diarrhea and nausea.       Rare gerd  Genitourinary:  Negative for dysuria.  Musculoskeletal:  Positive for arthralgias (occ). Negative for back pain.  Skin:  Negative for rash.  Neurological:  Positive for numbness (intermittent N/T and burning). Negative for light-headedness and headaches.  Psychiatric/Behavioral:  Negative for dysphoric mood. The patient is not nervous/anxious.        Objective:   Vitals:   07/12/22 0921  BP: 110/70  Pulse: 63  Temp: 98.6 F (37 C)  SpO2: 98%   Filed Weights   07/12/22 0921  Weight: 168 lb (76.2 kg)   Body mass index is 29.76 kg/m.  BP Readings from Last 3 Encounters:  07/12/22 110/70  01/11/22 130/72  11/24/20 130/78    Wt Readings from Last 3 Encounters:  07/12/22 168 lb (76.2 kg)  01/11/22 166 lb 12.8 oz (75.7 kg)  11/24/20 170 lb 6.4 oz (77.3 kg)       Physical Exam Constitutional: She appears well-developed and well-nourished. No distress.  HENT:  Head: Normocephalic and atraumatic.  Right Ear: External ear normal. Normal ear canal and TM Left Ear: External ear normal.  Normal ear canal and TM Mouth/Throat: Oropharynx is clear and moist.  Eyes: Conjunctivae normal.  Neck: Neck supple. No tracheal deviation present. No thyromegaly present.  No  carotid bruit  Cardiovascular: Normal rate, regular rhythm and normal heart sounds.   No murmur heard.  No edema. Pulmonary/Chest: Effort normal and breath sounds normal. No respiratory distress. She has no wheezes. She has no rales.  Breast: deferred   Abdominal: Soft. She exhibits no distension. There is no tenderness.  Lymphadenopathy: She has no cervical adenopathy.  Skin: Skin is warm and dry. She is not diaphoretic.  Psychiatric: She has a normal mood and affect. Her behavior is normal.     Lab Results  Component Value Date   WBC 7.4 11/24/2020   HGB 12.1 11/24/2020   HCT 36.6 11/24/2020   PLT 280.0 11/24/2020   GLUCOSE  123 (H) 01/11/2022   CHOL 187 01/11/2022   TRIG 105.0 01/11/2022   HDL 43.90 01/11/2022   LDLCALC 122 (H) 01/11/2022   ALT 32 01/11/2022   AST 29 01/11/2022   NA 137 01/11/2022   K 3.8 01/11/2022   CL 98 01/11/2022   CREATININE 0.74 01/11/2022   BUN 12 01/11/2022   CO2 35 (H) 01/11/2022   TSH 2.30 11/24/2020   HGBA1C 6.7 (H) 01/11/2022   MICROALBUR 0.9 01/11/2022         Assessment & Plan:   Physical exam: Screening blood work  ordered Exercise  not exercising regularly. -- stressed exercise Weight  advised weight loss Substance abuse  none   Reviewed recommended immunizations.    Second shingles vaccine given   Health Maintenance  Topic Date Due   FOOT EXAM  Never done   OPHTHALMOLOGY EXAM  Never done   DEXA SCAN  09/27/2017   MAMMOGRAM  10/07/2019   Zoster Vaccines- Shingrix (2 of 2) 03/08/2022   INFLUENZA VACCINE  07/09/2022   HEMOGLOBIN A1C  07/11/2022   COVID-19 Vaccine (1) 07/27/2022 (Originally 09/28/1956)   Diabetic kidney evaluation - GFR measurement  01/11/2023   Diabetic kidney evaluation - Urine ACR  01/11/2023   TETANUS/TDAP  05/05/2028   COLONOSCOPY (Pts 45-31yr Insurance coverage will need to be confirmed)  11/27/2028   Pneumonia Vaccine 66 Years old  Completed   Hepatitis C Screening  Completed   HPV VACCINES  Aged Out          See Problem List for Assessment and Plan of chronic medical problems.

## 2022-07-12 ENCOUNTER — Ambulatory Visit (INDEPENDENT_AMBULATORY_CARE_PROVIDER_SITE_OTHER): Payer: 59 | Admitting: Internal Medicine

## 2022-07-12 ENCOUNTER — Other Ambulatory Visit (HOSPITAL_COMMUNITY): Payer: Self-pay

## 2022-07-12 VITALS — BP 110/70 | HR 63 | Temp 98.6°F | Ht 63.0 in | Wt 168.0 lb

## 2022-07-12 DIAGNOSIS — E785 Hyperlipidemia, unspecified: Secondary | ICD-10-CM | POA: Diagnosis not present

## 2022-07-12 DIAGNOSIS — Z0000 Encounter for general adult medical examination without abnormal findings: Secondary | ICD-10-CM

## 2022-07-12 DIAGNOSIS — I10 Essential (primary) hypertension: Secondary | ICD-10-CM | POA: Diagnosis not present

## 2022-07-12 DIAGNOSIS — E119 Type 2 diabetes mellitus without complications: Secondary | ICD-10-CM | POA: Diagnosis not present

## 2022-07-12 DIAGNOSIS — Z23 Encounter for immunization: Secondary | ICD-10-CM

## 2022-07-12 DIAGNOSIS — N813 Complete uterovaginal prolapse: Secondary | ICD-10-CM | POA: Insufficient documentation

## 2022-07-12 DIAGNOSIS — M8589 Other specified disorders of bone density and structure, multiple sites: Secondary | ICD-10-CM | POA: Diagnosis not present

## 2022-07-12 DIAGNOSIS — E559 Vitamin D deficiency, unspecified: Secondary | ICD-10-CM

## 2022-07-12 DIAGNOSIS — L811 Chloasma: Secondary | ICD-10-CM | POA: Insufficient documentation

## 2022-07-12 DIAGNOSIS — E782 Mixed hyperlipidemia: Secondary | ICD-10-CM

## 2022-07-12 LAB — CBC WITH DIFFERENTIAL/PLATELET
Basophils Absolute: 0 10*3/uL (ref 0.0–0.1)
Basophils Relative: 0.6 % (ref 0.0–3.0)
Eosinophils Absolute: 0.2 10*3/uL (ref 0.0–0.7)
Eosinophils Relative: 2.7 % (ref 0.0–5.0)
HCT: 36.8 % (ref 36.0–46.0)
Hemoglobin: 12.3 g/dL (ref 12.0–15.0)
Lymphocytes Relative: 34.4 % (ref 12.0–46.0)
Lymphs Abs: 2.7 10*3/uL (ref 0.7–4.0)
MCHC: 33.4 g/dL (ref 30.0–36.0)
MCV: 89.6 fl (ref 78.0–100.0)
Monocytes Absolute: 0.8 10*3/uL (ref 0.1–1.0)
Monocytes Relative: 10.3 % (ref 3.0–12.0)
Neutro Abs: 4.1 10*3/uL (ref 1.4–7.7)
Neutrophils Relative %: 52 % (ref 43.0–77.0)
Platelets: 273 10*3/uL (ref 150.0–400.0)
RBC: 4.11 Mil/uL (ref 3.87–5.11)
RDW: 13.8 % (ref 11.5–15.5)
WBC: 7.8 10*3/uL (ref 4.0–10.5)

## 2022-07-12 LAB — HEMOGLOBIN A1C: Hgb A1c MFr Bld: 7 % — ABNORMAL HIGH (ref 4.6–6.5)

## 2022-07-12 LAB — COMPREHENSIVE METABOLIC PANEL
ALT: 23 U/L (ref 0–35)
AST: 24 U/L (ref 0–37)
Albumin: 4 g/dL (ref 3.5–5.2)
Alkaline Phosphatase: 65 U/L (ref 39–117)
BUN: 13 mg/dL (ref 6–23)
CO2: 32 mEq/L (ref 19–32)
Calcium: 9.7 mg/dL (ref 8.4–10.5)
Chloride: 102 mEq/L (ref 96–112)
Creatinine, Ser: 0.76 mg/dL (ref 0.40–1.20)
GFR: 80.59 mL/min (ref >60.00)
Glucose, Bld: 113 mg/dL — ABNORMAL HIGH (ref 70–99)
Potassium: 3.4 mEq/L — ABNORMAL LOW (ref 3.5–5.1)
Sodium: 140 mEq/L (ref 135–145)
Total Bilirubin: 0.5 mg/dL (ref 0.2–1.2)
Total Protein: 7.3 g/dL (ref 6.0–8.3)

## 2022-07-12 LAB — LIPID PANEL
Cholesterol: 142 mg/dL (ref 0–200)
HDL: 40.2 mg/dL (ref >39.00)
LDL Cholesterol: 85 mg/dL (ref 0–99)
NonHDL: 101.49
Total CHOL/HDL Ratio: 4
Triglycerides: 83 mg/dL (ref 0.0–149.0)
VLDL: 16.6 mg/dL (ref 0.0–40.0)

## 2022-07-12 LAB — TSH: TSH: 2.75 u[IU]/mL (ref 0.35–5.50)

## 2022-07-12 LAB — VITAMIN D 25 HYDROXY (VIT D DEFICIENCY, FRACTURES): VITD: 27.84 ng/mL — ABNORMAL LOW (ref 30.00–100.00)

## 2022-07-12 MED ORDER — ROSUVASTATIN CALCIUM 10 MG PO TABS
ORAL_TABLET | Freq: Every day | ORAL | 1 refills | Status: DC
  Filled 2022-07-12: qty 90, fill #0
  Filled 2022-09-30: qty 90, 90d supply, fill #0
  Filled 2023-01-28: qty 90, 90d supply, fill #1

## 2022-07-12 MED ORDER — HYDROCHLOROTHIAZIDE 25 MG PO TABS
25.0000 mg | ORAL_TABLET | Freq: Every day | ORAL | 1 refills | Status: DC
  Filled 2022-07-12 – 2022-09-30 (×2): qty 90, 90d supply, fill #0
  Filled 2023-01-28: qty 90, 90d supply, fill #1

## 2022-07-12 MED ORDER — METOPROLOL TARTRATE 50 MG PO TABS
50.0000 mg | ORAL_TABLET | Freq: Every day | ORAL | 1 refills | Status: DC
  Filled 2022-07-12: qty 90, 90d supply, fill #0
  Filled 2022-09-30: qty 90, 90d supply, fill #1

## 2022-07-12 MED ORDER — NYSTATIN 100000 UNIT/GM EX CREA
TOPICAL_CREAM | CUTANEOUS | 3 refills | Status: DC
  Filled 2022-07-12: qty 30, 30d supply, fill #0
  Filled 2022-10-27: qty 30, 30d supply, fill #1
  Filled 2023-04-24: qty 30, 30d supply, fill #2
  Filled 2023-07-06: qty 30, 30d supply, fill #3

## 2022-07-12 MED ORDER — NYSTATIN 100000 UNIT/GM EX POWD
Freq: Four times a day (QID) | CUTANEOUS | 5 refills | Status: DC
Start: 2022-07-12 — End: 2024-04-22
  Filled 2022-07-12: qty 60, 30d supply, fill #0
  Filled 2022-10-27: qty 60, 30d supply, fill #1
  Filled 2023-04-24: qty 60, 30d supply, fill #2
  Filled 2023-07-06: qty 60, 30d supply, fill #3

## 2022-07-12 NOTE — Assessment & Plan Note (Signed)
Chronic  Lab Results  Component Value Date   HGBA1C 6.7 (H) 01/11/2022   Sugars controlled Check A1c Continue lifestyle control Stressed regular exercise, diabetic diet

## 2022-07-12 NOTE — Assessment & Plan Note (Signed)
Chronic Blood pressure well controlled CMP Continue hydrochlorothiazide 25 mg daily, metoprolol 50 mg daily

## 2022-07-12 NOTE — Assessment & Plan Note (Signed)
Chronic DEXA up-to-date-managed by GYN Will attempt to get report Continue calcium, vitamin D Discussed importance of regular exercise

## 2022-07-12 NOTE — Assessment & Plan Note (Signed)
Chronic Regular exercise and healthy diet encouraged Check lipid panel  Continue Crestor 10 mg daily 

## 2022-07-12 NOTE — Assessment & Plan Note (Signed)
Chronic Taking vitamin D daily Check vitamin D level  

## 2022-08-07 ENCOUNTER — Telehealth: Payer: Self-pay

## 2022-08-07 NOTE — Telephone Encounter (Signed)
Requested Faxed from [facility] send

## 2022-08-22 ENCOUNTER — Encounter: Payer: Self-pay | Admitting: Internal Medicine

## 2022-08-22 NOTE — Progress Notes (Signed)
Outside notes received. Information abstracted. Notes sent to scan.  

## 2022-09-30 ENCOUNTER — Other Ambulatory Visit (HOSPITAL_COMMUNITY): Payer: Self-pay

## 2022-10-10 DIAGNOSIS — I10 Essential (primary) hypertension: Secondary | ICD-10-CM | POA: Diagnosis not present

## 2022-10-10 DIAGNOSIS — Z6829 Body mass index (BMI) 29.0-29.9, adult: Secondary | ICD-10-CM | POA: Diagnosis not present

## 2022-10-10 DIAGNOSIS — Z01411 Encounter for gynecological examination (general) (routine) with abnormal findings: Secondary | ICD-10-CM | POA: Diagnosis not present

## 2022-10-10 DIAGNOSIS — M8580 Other specified disorders of bone density and structure, unspecified site: Secondary | ICD-10-CM | POA: Diagnosis not present

## 2022-10-10 DIAGNOSIS — Z1231 Encounter for screening mammogram for malignant neoplasm of breast: Secondary | ICD-10-CM | POA: Diagnosis not present

## 2022-10-22 DIAGNOSIS — H35363 Drusen (degenerative) of macula, bilateral: Secondary | ICD-10-CM | POA: Diagnosis not present

## 2022-10-22 DIAGNOSIS — H43813 Vitreous degeneration, bilateral: Secondary | ICD-10-CM | POA: Diagnosis not present

## 2022-10-22 DIAGNOSIS — H2513 Age-related nuclear cataract, bilateral: Secondary | ICD-10-CM | POA: Diagnosis not present

## 2022-10-22 DIAGNOSIS — H16223 Keratoconjunctivitis sicca, not specified as Sjogren's, bilateral: Secondary | ICD-10-CM | POA: Diagnosis not present

## 2022-10-22 DIAGNOSIS — H40013 Open angle with borderline findings, low risk, bilateral: Secondary | ICD-10-CM | POA: Diagnosis not present

## 2022-10-25 ENCOUNTER — Other Ambulatory Visit (HOSPITAL_COMMUNITY): Payer: Self-pay

## 2022-10-25 MED ORDER — CYCLOSPORINE 0.05 % OP EMUL
1.0000 [drp] | Freq: Two times a day (BID) | OPHTHALMIC | 6 refills | Status: AC
  Filled 2022-10-25: qty 180, 90d supply, fill #0
  Filled 2023-04-24: qty 180, 90d supply, fill #1

## 2022-10-27 ENCOUNTER — Other Ambulatory Visit (HOSPITAL_COMMUNITY): Payer: Self-pay

## 2022-10-28 ENCOUNTER — Other Ambulatory Visit (HOSPITAL_COMMUNITY): Payer: Self-pay

## 2022-10-29 ENCOUNTER — Other Ambulatory Visit (HOSPITAL_COMMUNITY): Payer: Self-pay

## 2023-01-16 ENCOUNTER — Encounter: Payer: Self-pay | Admitting: Internal Medicine

## 2023-01-16 NOTE — Patient Instructions (Addendum)
      Blood work was ordered.   The lab is on the first floor.     Medications changes include :   None      Return in about 6 months (around 07/18/2023) for Physical Exam.

## 2023-01-16 NOTE — Progress Notes (Signed)
Subjective:    Patient ID: Yesenia Larson, female    DOB: May 26, 1956, 67 y.o.   MRN: [id number]     HPI Yesenia Larson is here for follow up of her chronic medical problems, including htn, hld, DM, osteopenia  She is not exercising regularly.   She will walk in the warmer weather.    Pain left buttock and posterior upper leg pain.  Started about 3 week ago  - it has gotten a little better.  Takes tylenol arthritis.      Medications and allergies reviewed with patient and updated if appropriate.  Current Outpatient Medications on File Prior to Visit  Medication Sig Dispense Refill   calcium carbonate 200 MG capsule Take 250 mg by mouth 2 (two) times daily with a meal.     clotrimazole-betamethasone (LOTRISONE) cream Apply 1 application topically 2 (two) times daily. 30 g 0   cycloSPORINE (RESTASIS) 0.05 % ophthalmic emulsion Place 1 drop into both eyes 2 (two) times daily.     cycloSPORINE (RESTASIS) 0.05 % ophthalmic emulsion Instill 1 drop into both eyes twice a day 180 each 6   fexofenadine (SM FEXOFENADINE HCL) 180 MG tablet TAKE 1 TABLET BY MOUTH ONCE A DAY 90 tablet 3   fish oil-omega-3 fatty acids 1000 MG capsule Take 2 g by mouth daily.     Fluocin-Hydroquinone-Tretinoin (TRI-LUMA) 0.01-4-0.05 % CREA      Glucosamine HCl (GLUCOSAMINE PO) Take by mouth 2 (two) times daily.     hydrochlorothiazide (HYDRODIURIL) 25 MG tablet Take 1 tablet (25 mg total) by mouth daily. 90 tablet 1   metoprolol tartrate (LOPRESSOR) 50 MG tablet Take 1 tablet (50 mg total) by mouth daily. 90 tablet 1   Multiple Vitamin (MULTIVITAMIN) tablet Take 1 tablet by mouth daily.     nystatin (MYCOSTATIN/NYSTOP) powder Apply topically 4 (four) times daily. 60 g 5   nystatin cream (MYCOSTATIN) APPLY TO THE AFFECTED AREA(S) 2 TIMES PER DAY 30 g 3   RESTASIS 0.05 % ophthalmic emulsion      rosuvastatin (CRESTOR) 10 MG tablet TAKE 1 TABLET BY MOUTH DAILY. 90 tablet 1   No current facility-administered  medications on file prior to visit.     Review of Systems  Constitutional:  Negative for fever.  Eyes:  Negative for visual disturbance.  Respiratory:  Negative for cough, shortness of breath and wheezing.   Cardiovascular:  Negative for chest pain, palpitations and leg swelling.  Gastrointestinal:  Negative for abdominal pain, blood in stool, constipation and diarrhea.       No gerd  Genitourinary:  Negative for dysuria.  Musculoskeletal:  Positive for back pain. Negative for arthralgias.  Skin:  Negative for rash.  Neurological:  Negative for dizziness, light-headedness and headaches.  Psychiatric/Behavioral:  Negative for dysphoric mood. The patient is not nervous/anxious.        Objective:   Vitals:   01/17/23 0857  BP: 120/80  Pulse: 79  Temp: 98 F (36.7 C)  SpO2: 97%   BP Readings from Last 3 Encounters:  01/17/23 120/80  07/12/22 110/70  01/11/22 130/72   Wt Readings from Last 3 Encounters:  01/17/23 169 lb (76.7 kg)  07/12/22 168 lb (76.2 kg)  01/11/22 166 lb 12.8 oz (75.7 kg)   Body mass index is 29.94 kg/m.    Physical Exam Constitutional:      General: She is not in acute distress.    Appearance: Normal appearance.  HENT:  Head: Normocephalic and atraumatic.  Eyes:     Conjunctiva/sclera: Conjunctivae normal.  Cardiovascular:     Rate and Rhythm: Normal rate and regular rhythm.     Heart sounds: Normal heart sounds. No murmur heard. Pulmonary:     Effort: Pulmonary effort is normal. No respiratory distress.     Breath sounds: Normal breath sounds. No wheezing.  Musculoskeletal:     Cervical back: Neck supple.     Right lower leg: No edema.     Left lower leg: No edema.  Lymphadenopathy:     Cervical: No cervical adenopathy.  Skin:    General: Skin is warm and dry.     Findings: No rash.  Neurological:     Mental Status: She is alert. Mental status is at baseline.  Psychiatric:        Mood and Affect: Mood normal.        Behavior:  Behavior normal.        Lab Results  Component Value Date   WBC 7.8 07/12/2022   HGB 12.3 07/12/2022   HCT 36.8 07/12/2022   PLT 273.0 07/12/2022   GLUCOSE 113 (H) 07/12/2022   CHOL 142 07/12/2022   TRIG 83.0 07/12/2022   HDL 40.20 07/12/2022   LDLCALC 85 07/12/2022   ALT 23 07/12/2022   AST 24 07/12/2022   NA 140 07/12/2022   K 3.4 (L) 07/12/2022   CL 102 07/12/2022   CREATININE 0.76 07/12/2022   BUN 13 07/12/2022   CO2 32 07/12/2022   TSH 2.75 07/12/2022   HGBA1C 7.0 (H) 07/12/2022   MICROALBUR 0.9 01/11/2022     Assessment & Plan:    See Problem List for Assessment and Plan of chronic medical problems.

## 2023-01-17 ENCOUNTER — Ambulatory Visit: Payer: 59 | Admitting: Internal Medicine

## 2023-01-17 VITALS — BP 120/80 | HR 79 | Temp 98.0°F | Ht 63.0 in | Wt 169.0 lb

## 2023-01-17 DIAGNOSIS — M5416 Radiculopathy, lumbar region: Secondary | ICD-10-CM

## 2023-01-17 DIAGNOSIS — E119 Type 2 diabetes mellitus without complications: Secondary | ICD-10-CM | POA: Diagnosis not present

## 2023-01-17 DIAGNOSIS — I10 Essential (primary) hypertension: Secondary | ICD-10-CM | POA: Diagnosis not present

## 2023-01-17 DIAGNOSIS — M8589 Other specified disorders of bone density and structure, multiple sites: Secondary | ICD-10-CM

## 2023-01-17 DIAGNOSIS — E559 Vitamin D deficiency, unspecified: Secondary | ICD-10-CM

## 2023-01-17 DIAGNOSIS — E782 Mixed hyperlipidemia: Secondary | ICD-10-CM

## 2023-01-17 LAB — COMPREHENSIVE METABOLIC PANEL
ALT: 22 U/L (ref 0–35)
AST: 22 U/L (ref 0–37)
Albumin: 4 g/dL (ref 3.5–5.2)
Alkaline Phosphatase: 60 U/L (ref 39–117)
BUN: 11 mg/dL (ref 6–23)
CO2: 30 mEq/L (ref 19–32)
Calcium: 9.3 mg/dL (ref 8.4–10.5)
Chloride: 101 mEq/L (ref 96–112)
Creatinine, Ser: 0.77 mg/dL (ref 0.40–1.20)
GFR: 79.05 mL/min (ref >60.00)
Glucose, Bld: 153 mg/dL — ABNORMAL HIGH (ref 70–99)
Potassium: 3.8 mEq/L (ref 3.5–5.1)
Sodium: 140 mEq/L (ref 135–145)
Total Bilirubin: 0.5 mg/dL (ref 0.2–1.2)
Total Protein: 7.1 g/dL (ref 6.0–8.3)

## 2023-01-17 LAB — HEMOGLOBIN A1C: Hgb A1c MFr Bld: 7.3 % — ABNORMAL HIGH (ref 4.6–6.5)

## 2023-01-17 LAB — LIPID PANEL
Cholesterol: 150 mg/dL (ref 0–200)
HDL: 39.8 mg/dL (ref >39.00)
LDL Cholesterol: 92 mg/dL (ref 0–99)
NonHDL: 110.68
Total CHOL/HDL Ratio: 4
Triglycerides: 93 mg/dL (ref 0.0–149.0)
VLDL: 18.6 mg/dL (ref 0.0–40.0)

## 2023-01-17 LAB — MICROALBUMIN / CREATININE URINE RATIO
Creatinine,U: 202.1 mg/dL
Microalb Creat Ratio: 0.8 mg/g (ref 0.0–30.0)
Microalb, Ur: 1.7 mg/dL (ref 0.0–1.9)

## 2023-01-17 LAB — VITAMIN D 25 HYDROXY (VIT D DEFICIENCY, FRACTURES): VITD: 24.51 ng/mL — ABNORMAL LOW (ref 30.00–100.00)

## 2023-01-17 NOTE — Assessment & Plan Note (Signed)
Chronic Taking vitamin D daily Check vitamin D level  

## 2023-01-17 NOTE — Assessment & Plan Note (Signed)
Chronic Regular exercise and healthy diet encouraged Check lipid panel  Continue Crestor 10 mg daily 

## 2023-01-17 NOTE — Assessment & Plan Note (Signed)
Chronic Intermittent Flare currently - getting better Continue tylenol, ice and heat

## 2023-01-17 NOTE — Assessment & Plan Note (Signed)
Chronic DEXA done with gynecology Continue calcium and vitamin D supplementation Stressed regular exercise

## 2023-01-17 NOTE — Assessment & Plan Note (Signed)
Chronic   Lab Results  Component Value Date   HGBA1C 7.0 (H) 07/12/2022   Sugars borderline controlled Check A1c, urine microalbumin today Currently lifestyle controlled Discussed that if sugars are still not ideally controlled starting medication Stressed regular exercise, diabetic diet

## 2023-01-17 NOTE — Assessment & Plan Note (Signed)
Chronic Blood pressure well controlled CMP Continue HCTZ 25 mg daily, metoprolol 50 mg daily

## 2023-01-20 ENCOUNTER — Encounter: Payer: Self-pay | Admitting: Internal Medicine

## 2023-01-23 MED ORDER — TIRZEPATIDE 2.5 MG/0.5ML ~~LOC~~ SOAJ
2.5000 mg | SUBCUTANEOUS | 0 refills | Status: DC
  Filled 2023-01-23 – 2023-02-18 (×2): qty 2, 28d supply, fill #0

## 2023-01-23 NOTE — Addendum Note (Signed)
Addended by: Binnie Rail on: 01/23/2023 08:54 PM   Modules accepted: Orders

## 2023-01-24 ENCOUNTER — Other Ambulatory Visit: Payer: Self-pay

## 2023-01-28 ENCOUNTER — Other Ambulatory Visit: Payer: Self-pay

## 2023-01-28 ENCOUNTER — Other Ambulatory Visit (HOSPITAL_COMMUNITY): Payer: Self-pay

## 2023-01-28 ENCOUNTER — Other Ambulatory Visit: Payer: Self-pay | Admitting: Internal Medicine

## 2023-01-28 DIAGNOSIS — I10 Essential (primary) hypertension: Secondary | ICD-10-CM

## 2023-01-28 MED ORDER — METOPROLOL TARTRATE 50 MG PO TABS
50.0000 mg | ORAL_TABLET | Freq: Every day | ORAL | 1 refills | Status: DC
  Filled 2023-01-28: qty 90, 90d supply, fill #0
  Filled 2023-04-24: qty 90, 90d supply, fill #1

## 2023-01-29 ENCOUNTER — Telehealth: Payer: Self-pay

## 2023-01-29 NOTE — Telephone Encounter (Signed)
Yesenia Larson ([id number])  form thumbnail This request has been approved.  Please note any additional information provided by [employer] at the bottom of your screen.

## 2023-02-15 ENCOUNTER — Other Ambulatory Visit (HOSPITAL_COMMUNITY): Payer: Self-pay

## 2023-02-17 ENCOUNTER — Other Ambulatory Visit (HOSPITAL_COMMUNITY): Payer: Self-pay

## 2023-02-17 ENCOUNTER — Other Ambulatory Visit: Payer: Self-pay

## 2023-02-18 ENCOUNTER — Other Ambulatory Visit: Payer: Self-pay

## 2023-02-18 ENCOUNTER — Other Ambulatory Visit (HOSPITAL_COMMUNITY): Payer: Self-pay

## 2023-02-21 ENCOUNTER — Other Ambulatory Visit: Payer: Self-pay

## 2023-02-21 ENCOUNTER — Telehealth: Payer: Self-pay | Admitting: Internal Medicine

## 2023-02-21 ENCOUNTER — Other Ambulatory Visit (HOSPITAL_COMMUNITY): Payer: Self-pay

## 2023-02-21 DIAGNOSIS — E119 Type 2 diabetes mellitus without complications: Secondary | ICD-10-CM

## 2023-02-21 MED ORDER — BLOOD GLUCOSE METER KIT
PACK | 0 refills | Status: AC
  Filled 2023-02-21 – 2023-04-04 (×3): qty 1, 30d supply, fill #0

## 2023-02-21 NOTE — Telephone Encounter (Signed)
Kit and supplies sent in to pharmacy today

## 2023-02-21 NOTE — Telephone Encounter (Signed)
PT visits today to pay off a due balance as well as to inquire on her options for glucose monitoring. PT wanted to know what Dr.Burns would suggest for monitoring the glucose and then having a prescription sent out for that meter. She stated she would probably be interested in just the traditional glucose meter and strips.  CB: [phone]

## 2023-02-22 ENCOUNTER — Other Ambulatory Visit (HOSPITAL_COMMUNITY): Payer: Self-pay

## 2023-03-04 ENCOUNTER — Other Ambulatory Visit (HOSPITAL_COMMUNITY): Payer: Self-pay

## 2023-03-11 ENCOUNTER — Other Ambulatory Visit (HOSPITAL_COMMUNITY): Payer: Self-pay

## 2023-03-12 ENCOUNTER — Other Ambulatory Visit: Payer: Self-pay

## 2023-03-12 ENCOUNTER — Other Ambulatory Visit (HOSPITAL_COMMUNITY): Payer: Self-pay

## 2023-03-12 MED ORDER — FREESTYLE LITE TEST VI STRP
ORAL_STRIP | 0 refills | Status: DC
  Filled 2023-03-12 – 2023-04-04 (×3): qty 100, 25d supply, fill #0

## 2023-03-12 MED ORDER — FREESTYLE LANCETS MISC
0 refills | Status: AC
  Filled 2023-03-12 – 2023-04-04 (×3): qty 100, 25d supply, fill #0

## 2023-03-13 ENCOUNTER — Other Ambulatory Visit: Payer: Self-pay

## 2023-03-13 ENCOUNTER — Other Ambulatory Visit: Payer: Self-pay | Admitting: Internal Medicine

## 2023-03-13 ENCOUNTER — Other Ambulatory Visit (HOSPITAL_COMMUNITY): Payer: Self-pay

## 2023-03-13 MED ORDER — MOUNJARO 2.5 MG/0.5ML ~~LOC~~ SOAJ
2.5000 mg | SUBCUTANEOUS | 0 refills | Status: DC
  Filled 2023-03-13: qty 2, 28d supply, fill #0

## 2023-03-14 ENCOUNTER — Other Ambulatory Visit: Payer: Self-pay

## 2023-04-04 ENCOUNTER — Other Ambulatory Visit (HOSPITAL_COMMUNITY): Payer: Self-pay

## 2023-04-10 ENCOUNTER — Other Ambulatory Visit (HOSPITAL_COMMUNITY): Payer: Self-pay

## 2023-04-10 ENCOUNTER — Other Ambulatory Visit: Payer: Self-pay

## 2023-04-10 ENCOUNTER — Other Ambulatory Visit: Payer: Self-pay | Admitting: Internal Medicine

## 2023-04-10 MED ORDER — MOUNJARO 2.5 MG/0.5ML ~~LOC~~ SOAJ
2.5000 mg | SUBCUTANEOUS | 0 refills | Status: DC
  Filled 2023-04-10: qty 2, 28d supply, fill #0

## 2023-04-21 ENCOUNTER — Encounter: Payer: Self-pay | Admitting: Internal Medicine

## 2023-04-22 ENCOUNTER — Other Ambulatory Visit (HOSPITAL_COMMUNITY): Payer: Self-pay

## 2023-04-22 ENCOUNTER — Other Ambulatory Visit: Payer: Self-pay

## 2023-04-22 MED ORDER — TIRZEPATIDE 5 MG/0.5ML ~~LOC~~ SOAJ
5.0000 mg | SUBCUTANEOUS | 1 refills | Status: DC
  Filled 2023-04-22 – 2023-05-03 (×4): qty 2, 28d supply, fill #0
  Filled 2023-06-02 – 2023-07-06 (×2): qty 2, 28d supply, fill #1

## 2023-04-24 ENCOUNTER — Other Ambulatory Visit: Payer: Self-pay

## 2023-04-24 ENCOUNTER — Other Ambulatory Visit (HOSPITAL_COMMUNITY): Payer: Self-pay

## 2023-04-24 ENCOUNTER — Other Ambulatory Visit: Payer: Self-pay | Admitting: Internal Medicine

## 2023-04-24 DIAGNOSIS — E785 Hyperlipidemia, unspecified: Secondary | ICD-10-CM

## 2023-04-24 DIAGNOSIS — I10 Essential (primary) hypertension: Secondary | ICD-10-CM

## 2023-04-24 MED ORDER — ROSUVASTATIN CALCIUM 10 MG PO TABS
10.0000 mg | ORAL_TABLET | Freq: Every day | ORAL | 1 refills | Status: DC
  Filled 2023-04-24: qty 90, 90d supply, fill #0

## 2023-04-24 MED ORDER — HYDROCHLOROTHIAZIDE 25 MG PO TABS
25.0000 mg | ORAL_TABLET | Freq: Every day | ORAL | 1 refills | Status: DC
  Filled 2023-04-24: qty 90, 90d supply, fill #0

## 2023-04-25 ENCOUNTER — Other Ambulatory Visit (HOSPITAL_COMMUNITY): Payer: Self-pay

## 2023-04-25 ENCOUNTER — Other Ambulatory Visit: Payer: Self-pay

## 2023-04-26 ENCOUNTER — Other Ambulatory Visit (HOSPITAL_COMMUNITY): Payer: Self-pay

## 2023-04-28 ENCOUNTER — Other Ambulatory Visit (HOSPITAL_COMMUNITY): Payer: Self-pay

## 2023-05-03 ENCOUNTER — Other Ambulatory Visit (HOSPITAL_COMMUNITY): Payer: Self-pay

## 2023-05-05 ENCOUNTER — Other Ambulatory Visit: Payer: Self-pay

## 2023-05-06 ENCOUNTER — Other Ambulatory Visit: Payer: Self-pay

## 2023-06-03 ENCOUNTER — Other Ambulatory Visit (HOSPITAL_COMMUNITY): Payer: Self-pay

## 2023-06-03 ENCOUNTER — Other Ambulatory Visit: Payer: Self-pay

## 2023-06-04 ENCOUNTER — Other Ambulatory Visit (HOSPITAL_COMMUNITY): Payer: Self-pay

## 2023-06-04 ENCOUNTER — Other Ambulatory Visit (HOSPITAL_BASED_OUTPATIENT_CLINIC_OR_DEPARTMENT_OTHER): Payer: Self-pay

## 2023-07-07 ENCOUNTER — Other Ambulatory Visit: Payer: Self-pay

## 2023-07-17 ENCOUNTER — Encounter: Payer: Self-pay | Admitting: Internal Medicine

## 2023-07-17 NOTE — Progress Notes (Signed)
Subjective:    Patient ID: Yesenia Larson, female    DOB: Dec 24, 1955, 67 y.o.   MRN: [id number]     HPI Yesenia Larson is here for follow up of her chronic medical problems.  Had covid.  Has a rash - was dry and rough - it is itchy and .   ? From mounajro-she skipped her Mounjaro this week. No obvious cause.  Used some aveno cream.  Better - than it was.   Doing okay with Mounjaro-no side effects   Medications and allergies reviewed with patient and updated if appropriate.  Current Outpatient Medications on File Prior to Visit  Medication Sig Dispense Refill   blood glucose meter kit and supplies Use up to four times daily as directed.  1 each 0   calcium carbonate 200 MG capsule Take 250 mg by mouth 2 (two) times daily with a meal.     clotrimazole-betamethasone (LOTRISONE) cream Apply 1 application topically 2 (two) times daily. 30 g 0   cycloSPORINE (RESTASIS) 0.05 % ophthalmic emulsion Place 1 drop into both eyes 2 (two) times daily.     cycloSPORINE (RESTASIS) 0.05 % ophthalmic emulsion Instill 1 drop into both eyes twice a day 180 each 6   fexofenadine (SM FEXOFENADINE HCL) 180 MG tablet TAKE 1 TABLET BY MOUTH ONCE A DAY 90 tablet 3   fish oil-omega-3 fatty acids 1000 MG capsule Take 2 g by mouth daily.     Fluocin-Hydroquinone-Tretinoin (TRI-LUMA) 0.01-4-0.05 % CREA      Glucosamine HCl (GLUCOSAMINE PO) Take by mouth 2 (two) times daily.     glucose blood (FREESTYLE LITE) test strip Use as directed up to four times daily to test blood sugars. 100 each 0   hydrochlorothiazide (HYDRODIURIL) 25 MG tablet Take 1 tablet (25 mg total) by mouth daily. 90 tablet 1   Lancets (FREESTYLE) lancets Use as directed to test blood sugars up to four times daily. 100 each 0   metoprolol tartrate (LOPRESSOR) 50 MG tablet Take 1 tablet (50 mg total) by mouth daily. 90 tablet 1   Multiple Vitamin (MULTIVITAMIN) tablet Take 1 tablet by mouth daily.     nystatin (MYCOSTATIN/NYSTOP) powder  Apply topically 4 (four) times daily. 60 g 5   nystatin cream (MYCOSTATIN) APPLY TO THE AFFECTED AREA(S) 2 TIMES PER DAY 30 g 3   RESTASIS 0.05 % ophthalmic emulsion      rosuvastatin (CRESTOR) 10 MG tablet Take 1 tablet (10 mg total) by mouth daily. 90 tablet 1   tirzepatide (MOUNJARO) 5 MG/0.5ML Pen Inject 5 mg into the skin once a week. 2 mL 1   No current facility-administered medications on file prior to visit.     Review of Systems  Constitutional:  Negative for fever.  Respiratory:  Negative for cough, shortness of breath and wheezing.   Cardiovascular:  Negative for chest pain, palpitations and leg swelling.  Gastrointestinal:  Positive for constipation (mild at times with mounjaro).  Skin:  Positive for rash.  Neurological:  Positive for headaches (occ). Negative for light-headedness.       Objective:   Vitals:   07/18/23 0820  BP: 116/70  Pulse: 63  Temp: 98.5 F (36.9 C)  SpO2: 97%   BP Readings from Last 3 Encounters:  07/18/23 116/70  01/17/23 120/80  07/12/22 110/70   Wt Readings from Last 3 Encounters:  07/18/23 159 lb (72.1 kg)  01/17/23 169 lb (76.7 kg)  07/12/22 168 lb (76.2 kg)  Body mass index is 28.17 kg/m.    Physical Exam Constitutional:      General: She is not in acute distress.    Appearance: Normal appearance.  HENT:     Head: Normocephalic and atraumatic.  Eyes:     Conjunctiva/sclera: Conjunctivae normal.  Cardiovascular:     Rate and Rhythm: Normal rate and regular rhythm.     Heart sounds: Normal heart sounds.  Pulmonary:     Effort: Pulmonary effort is normal. No respiratory distress.     Breath sounds: Normal breath sounds. No wheezing.  Musculoskeletal:     Cervical back: Neck supple.     Right lower leg: No edema.     Left lower leg: No edema.  Lymphadenopathy:     Cervical: No cervical adenopathy.  Skin:    General: Skin is warm and dry.     Findings: No rash.  Neurological:     Mental Status: She is alert.  Mental status is at baseline.  Psychiatric:        Mood and Affect: Mood normal.        Behavior: Behavior normal.       Diabetic Foot Exam - Simple   Simple Foot Form Diabetic Foot exam was performed with the following findings: Yes 07/18/2023  9:06 AM  Visual Inspection No deformities, no ulcerations, no other skin breakdown bilaterally: Yes Sensation Testing Intact to touch and monofilament testing bilaterally: Yes Pulse Check Posterior Tibialis and Dorsalis pulse intact bilaterally: Yes Comments      Lab Results  Component Value Date   WBC 7.8 07/12/2022   HGB 12.3 07/12/2022   HCT 36.8 07/12/2022   PLT 273.0 07/12/2022   GLUCOSE 153 (H) 01/17/2023   CHOL 150 01/17/2023   TRIG 93.0 01/17/2023   HDL 39.80 01/17/2023   LDLCALC 92 01/17/2023   ALT 22 01/17/2023   AST 22 01/17/2023   NA 140 01/17/2023   K 3.8 01/17/2023   CL 101 01/17/2023   CREATININE 0.77 01/17/2023   BUN 11 01/17/2023   CO2 30 01/17/2023   TSH 2.75 07/12/2022   HGBA1C 7.3 (H) 01/17/2023   MICROALBUR 1.7 01/17/2023     Assessment & Plan:    See Problem List for Assessment and Plan of chronic medical problems.

## 2023-07-17 NOTE — Patient Instructions (Addendum)
      Blood work was ordered.   The lab is on the first floor.    Medications changes include :   triamcinolone cream for rash     Return in about 6 months (around 01/18/2024) for Physical Exam.

## 2023-07-18 ENCOUNTER — Other Ambulatory Visit (HOSPITAL_COMMUNITY): Payer: Self-pay

## 2023-07-18 ENCOUNTER — Ambulatory Visit: Payer: 59 | Admitting: Internal Medicine

## 2023-07-18 VITALS — BP 116/70 | HR 63 | Temp 98.5°F | Ht 63.0 in | Wt 159.0 lb

## 2023-07-18 DIAGNOSIS — I10 Essential (primary) hypertension: Secondary | ICD-10-CM

## 2023-07-18 DIAGNOSIS — E559 Vitamin D deficiency, unspecified: Secondary | ICD-10-CM

## 2023-07-18 DIAGNOSIS — E785 Hyperlipidemia, unspecified: Secondary | ICD-10-CM

## 2023-07-18 DIAGNOSIS — E119 Type 2 diabetes mellitus without complications: Secondary | ICD-10-CM | POA: Diagnosis not present

## 2023-07-18 DIAGNOSIS — E782 Mixed hyperlipidemia: Secondary | ICD-10-CM

## 2023-07-18 DIAGNOSIS — M8589 Other specified disorders of bone density and structure, multiple sites: Secondary | ICD-10-CM | POA: Diagnosis not present

## 2023-07-18 DIAGNOSIS — R21 Rash and other nonspecific skin eruption: Secondary | ICD-10-CM | POA: Diagnosis not present

## 2023-07-18 LAB — COMPREHENSIVE METABOLIC PANEL
ALT: 19 U/L (ref 0–35)
AST: 19 U/L (ref 0–37)
Albumin: 4 g/dL (ref 3.5–5.2)
Alkaline Phosphatase: 54 U/L (ref 39–117)
BUN: 14 mg/dL (ref 6–23)
CO2: 30 mEq/L (ref 19–32)
Calcium: 9.8 mg/dL (ref 8.4–10.5)
Chloride: 101 mEq/L (ref 96–112)
Creatinine, Ser: 0.75 mg/dL (ref 0.40–1.20)
GFR: 81.3 mL/min (ref >60.00)
Glucose, Bld: 92 mg/dL (ref 70–99)
Potassium: 3.5 mEq/L (ref 3.5–5.1)
Sodium: 137 mEq/L (ref 135–145)
Total Bilirubin: 0.5 mg/dL (ref 0.2–1.2)
Total Protein: 7.1 g/dL (ref 6.0–8.3)

## 2023-07-18 LAB — LIPID PANEL
Cholesterol: 171 mg/dL (ref 0–200)
HDL: 39 mg/dL — ABNORMAL LOW (ref >39.00)
LDL Cholesterol: 112 mg/dL — ABNORMAL HIGH (ref 0–99)
NonHDL: 131.96
Total CHOL/HDL Ratio: 4
Triglycerides: 102 mg/dL (ref 0.0–149.0)
VLDL: 20.4 mg/dL (ref 0.0–40.0)

## 2023-07-18 LAB — HEMOGLOBIN A1C: Hgb A1c MFr Bld: 5.8 % (ref 4.6–6.5)

## 2023-07-18 LAB — VITAMIN D 25 HYDROXY (VIT D DEFICIENCY, FRACTURES): VITD: 34.53 ng/mL (ref 30.00–100.00)

## 2023-07-18 MED ORDER — HYDROCHLOROTHIAZIDE 25 MG PO TABS
25.0000 mg | ORAL_TABLET | Freq: Every day | ORAL | 1 refills | Status: DC
  Filled 2023-07-18 (×2): qty 90, 90d supply, fill #0
  Filled 2023-11-10: qty 90, 90d supply, fill #1

## 2023-07-18 MED ORDER — ROSUVASTATIN CALCIUM 10 MG PO TABS
10.0000 mg | ORAL_TABLET | Freq: Every day | ORAL | 1 refills | Status: DC
Start: 2023-07-18 — End: 2024-01-20
  Filled 2023-07-18 (×2): qty 90, 90d supply, fill #0
  Filled 2023-11-10: qty 90, 90d supply, fill #1

## 2023-07-18 MED ORDER — TRIAMCINOLONE ACETONIDE 0.1 % EX CREA
1.0000 | TOPICAL_CREAM | Freq: Two times a day (BID) | CUTANEOUS | 0 refills | Status: AC
  Filled 2023-07-18 (×2): qty 30, 15d supply, fill #0

## 2023-07-18 MED ORDER — METOPROLOL TARTRATE 50 MG PO TABS
50.0000 mg | ORAL_TABLET | Freq: Every day | ORAL | 1 refills | Status: DC
Start: 2023-07-18 — End: 2024-01-20
  Filled 2023-07-18 (×2): qty 90, 90d supply, fill #0
  Filled 2023-11-10: qty 90, 90d supply, fill #1

## 2023-07-18 NOTE — Assessment & Plan Note (Signed)
Chronic Regular exercise and healthy diet encouraged Check lipid panel  Continue Crestor 10 mg daily 

## 2023-07-18 NOTE — Assessment & Plan Note (Signed)
Chronic Taking vitamin D daily Check vitamin D level  

## 2023-07-18 NOTE — Assessment & Plan Note (Signed)
Acute On Chest and lower neck Slightly better than it first started Unknown cause-I do not think it is related to the [facility], but cannot be sure Triamcinolone cream twice daily Discussed restarting Mounjaro since that does not seem to be the cause Call if rash does not improve

## 2023-07-18 NOTE — Assessment & Plan Note (Signed)
Chronic DEXA done with gynecology Continue calcium and vitamin D supplementation Check vitamin D level Stressed regular exercise

## 2023-07-18 NOTE — Assessment & Plan Note (Signed)
Chronic Blood pressure well controlled CMP Continue HCTZ 25 mg daily, metoprolol 50 mg daily

## 2023-07-18 NOTE — Assessment & Plan Note (Signed)
Chronic   Lab Results  Component Value Date   HGBA1C 7.3 (H) 01/17/2023   Sugars not ideally controlled Check A1c Continue Mounjaro 5 mg weekly Stressed regular exercise, diabetic diet

## 2023-08-04 ENCOUNTER — Other Ambulatory Visit: Payer: Self-pay | Admitting: Internal Medicine

## 2023-08-05 ENCOUNTER — Other Ambulatory Visit: Payer: Self-pay

## 2023-08-05 ENCOUNTER — Other Ambulatory Visit (HOSPITAL_COMMUNITY): Payer: Self-pay

## 2023-08-05 MED ORDER — FEXOFENADINE HCL 180 MG PO TABS
180.0000 mg | ORAL_TABLET | Freq: Every day | ORAL | 3 refills | Status: AC
  Filled 2023-08-05: qty 90, 90d supply, fill #0
  Filled 2023-11-10: qty 90, 90d supply, fill #1
  Filled 2024-03-30: qty 90, 90d supply, fill #2

## 2023-08-05 MED ORDER — MOUNJARO 5 MG/0.5ML ~~LOC~~ SOAJ
5.0000 mg | SUBCUTANEOUS | 1 refills | Status: DC
Start: 2023-08-05 — End: 2023-10-04
  Filled 2023-08-05: qty 2, 28d supply, fill #0
  Filled 2023-09-02: qty 2, 28d supply, fill #1

## 2023-08-06 ENCOUNTER — Other Ambulatory Visit: Payer: Self-pay

## 2023-08-20 ENCOUNTER — Other Ambulatory Visit (HOSPITAL_COMMUNITY): Payer: Self-pay

## 2023-09-02 ENCOUNTER — Other Ambulatory Visit (HOSPITAL_COMMUNITY): Payer: Self-pay

## 2023-10-04 ENCOUNTER — Other Ambulatory Visit: Payer: Self-pay | Admitting: Internal Medicine

## 2023-10-04 ENCOUNTER — Other Ambulatory Visit (HOSPITAL_COMMUNITY): Payer: Self-pay

## 2023-10-06 ENCOUNTER — Other Ambulatory Visit: Payer: Self-pay

## 2023-10-06 MED ORDER — MOUNJARO 5 MG/0.5ML ~~LOC~~ SOAJ
5.0000 mg | SUBCUTANEOUS | 1 refills | Status: DC
  Filled 2023-10-06: qty 2, 28d supply, fill #0
  Filled 2023-11-10: qty 2, 28d supply, fill #1

## 2023-10-14 DIAGNOSIS — Z1231 Encounter for screening mammogram for malignant neoplasm of breast: Secondary | ICD-10-CM | POA: Diagnosis not present

## 2023-10-24 DIAGNOSIS — Z90710 Acquired absence of both cervix and uterus: Secondary | ICD-10-CM | POA: Diagnosis not present

## 2023-10-24 DIAGNOSIS — E119 Type 2 diabetes mellitus without complications: Secondary | ICD-10-CM | POA: Diagnosis not present

## 2023-10-24 DIAGNOSIS — M8580 Other specified disorders of bone density and structure, unspecified site: Secondary | ICD-10-CM | POA: Diagnosis not present

## 2023-10-24 DIAGNOSIS — Z01411 Encounter for gynecological examination (general) (routine) with abnormal findings: Secondary | ICD-10-CM | POA: Diagnosis not present

## 2023-11-10 ENCOUNTER — Other Ambulatory Visit (HOSPITAL_COMMUNITY): Payer: Self-pay

## 2023-11-10 DIAGNOSIS — M85852 Other specified disorders of bone density and structure, left thigh: Secondary | ICD-10-CM | POA: Diagnosis not present

## 2023-11-10 DIAGNOSIS — Z780 Asymptomatic menopausal state: Secondary | ICD-10-CM | POA: Diagnosis not present

## 2023-11-13 DIAGNOSIS — E119 Type 2 diabetes mellitus without complications: Secondary | ICD-10-CM | POA: Diagnosis not present

## 2023-12-07 ENCOUNTER — Other Ambulatory Visit: Payer: Self-pay | Admitting: Internal Medicine

## 2023-12-08 ENCOUNTER — Other Ambulatory Visit: Payer: Self-pay

## 2023-12-08 MED ORDER — MOUNJARO 5 MG/0.5ML ~~LOC~~ SOAJ
5.0000 mg | SUBCUTANEOUS | 1 refills | Status: DC
  Filled 2023-12-08: qty 2, 28d supply, fill #0

## 2023-12-16 ENCOUNTER — Other Ambulatory Visit (HOSPITAL_COMMUNITY): Payer: Self-pay

## 2024-01-19 ENCOUNTER — Other Ambulatory Visit (HOSPITAL_COMMUNITY): Payer: Self-pay

## 2024-01-19 ENCOUNTER — Encounter: Payer: Self-pay | Admitting: Internal Medicine

## 2024-01-19 ENCOUNTER — Telehealth: Payer: Self-pay | Admitting: Pharmacy Technician

## 2024-01-19 NOTE — Patient Instructions (Addendum)
Blood work was ordered.       Medications changes include :   None     Return in about 6 months (around 07/19/2024) for follow up.    Health Maintenance, Female Adopting a healthy lifestyle and getting preventive care are important in promoting health and wellness. Ask your health care provider about: The right schedule for you to have regular tests and exams. Things you can do on your own to prevent diseases and keep yourself healthy. What should I know about diet, weight, and exercise? Eat a healthy diet  Eat a diet that includes plenty of vegetables, fruits, low-fat dairy products, and lean protein. Do not eat a lot of foods that are high in solid fats, added sugars, or sodium. Maintain a healthy weight Body mass index (BMI) is used to identify weight problems. It estimates body fat based on height and weight. Your health care provider can help determine your BMI and help you achieve or maintain a healthy weight. Get regular exercise Get regular exercise. This is one of the most important things you can do for your health. Most adults should: Exercise for at least 150 minutes each week. The exercise should increase your heart rate and make you sweat (moderate-intensity exercise). Do strengthening exercises at least twice a week. This is in addition to the moderate-intensity exercise. Spend less time sitting. Even light physical activity can be beneficial. Watch cholesterol and blood lipids Have your blood tested for lipids and cholesterol at 70 years of age, then have this test every 5 years. Have your cholesterol levels checked more often if: Your lipid or cholesterol levels are high. You are older than 70 years of age. You are at high risk for heart disease. What should I know about cancer screening? Depending on your health history and family history, you may need to have cancer screening at various ages. This may include screening for: Breast cancer. Cervical  cancer. Colorectal cancer. Skin cancer. Lung cancer. What should I know about heart disease, diabetes, and high blood pressure? Blood pressure and heart disease High blood pressure causes heart disease and increases the risk of stroke. This is more likely to develop in people who have high blood pressure readings or are overweight. Have your blood pressure checked: Every 3-5 years if you are 58-105 years of age. Every year if you are 75 years old or older. Diabetes Have regular diabetes screenings. This checks your fasting blood sugar level. Have the screening done: Once every three years after age 53 if you are at a normal weight and have a low risk for diabetes. More often and at a younger age if you are overweight or have a high risk for diabetes. What should I know about preventing infection? Hepatitis B If you have a higher risk for hepatitis B, you should be screened for this virus. Talk with your health care provider to find out if you are at risk for hepatitis B infection. Hepatitis C Testing is recommended for: Everyone born from [birth year] through 1965. Anyone with known risk factors for hepatitis C. Sexually transmitted infections (STIs) Get screened for STIs, including gonorrhea and chlamydia, if: You are sexually active and are younger than 70 years of age. You are older than 70 years of age and your health care provider tells you that you are at risk for this type of infection. Your sexual activity has changed since you were last screened, and you are at increased risk for chlamydia or  gonorrhea. Ask your health care provider if you are at risk. Ask your health care provider about whether you are at high risk for HIV. Your health care provider may recommend a prescription medicine to help prevent HIV infection. If you choose to take medicine to prevent HIV, you should first get tested for HIV. You should then be tested every 3 months for as long as you are taking the  medicine. Pregnancy If you are about to stop having your period (premenopausal) and you may become pregnant, seek counseling before you get pregnant. Take 400 to 800 micrograms (mcg) of folic acid every day if you become pregnant. Ask for birth control (contraception) if you want to prevent pregnancy. Osteoporosis and menopause Osteoporosis is a disease in which the bones lose minerals and strength with aging. This can result in bone fractures. If you are 56 years old or older, or if you are at risk for osteoporosis and fractures, ask your health care provider if you should: Be screened for bone loss. Take a calcium or vitamin D supplement to lower your risk of fractures. Be given hormone replacement therapy (HRT) to treat symptoms of menopause. Follow these instructions at home: Alcohol use Do not drink alcohol if: Your health care provider tells you not to drink. You are pregnant, may be pregnant, or are planning to become pregnant. If you drink alcohol: Limit how much you have to: 0-1 drink a day. Know how much alcohol is in your drink. In the [place] one drink equals one 12 oz bottle of beer (355 mL), one 5 oz glass of wine (148 mL), or one 1 oz glass of hard liquor (44 mL). Lifestyle Do not use any products that contain nicotine or tobacco. These products include cigarettes, chewing tobacco, and vaping devices, such as e-cigarettes. If you need help quitting, ask your health care provider. Do not use street drugs. Do not share needles. Ask your health care provider for help if you need support or information about quitting drugs. General instructions Schedule regular health, dental, and eye exams. Stay current with your vaccines. Tell your health care provider if: You often feel depressed. You have ever been abused or do not feel safe at home. Summary Adopting a healthy lifestyle and getting preventive care are important in promoting health and wellness. Follow your health care  provider's instructions about healthy diet, exercising, and getting tested or screened for diseases. Follow your health care provider's instructions on monitoring your cholesterol and blood pressure. This information is not intended to replace advice given to you by your health care provider. Make sure you discuss any questions you have with your health care provider. Document Revised: 04/16/2021 Document Reviewed: 04/16/2021 Elsevier Patient Education  2024 [employer].

## 2024-01-19 NOTE — Telephone Encounter (Signed)
 Pharmacy Patient Advocate Encounter  Received notification from [facility] that Prior Authorization for Mounjaro  2.5MG /0.5ML auto-injectors has been CANCELLED due to:       Key: [id number]

## 2024-01-19 NOTE — Progress Notes (Signed)
Subjective:    Patient ID: Yesenia Larson, female    DOB: [birth year], 68 y.o.   MRN: [id number]      HPI Yesenia Larson is here for a Physical exam and her chronic medical problems.    Mounjaro injection causes a red, itchy area on her skin.    Medications and allergies reviewed with patient and updated if appropriate.  Current Outpatient Medications on File Prior to Visit  Medication Sig Dispense Refill   blood glucose meter kit and supplies Use up to four times daily as directed.  1 each 0   calcium carbonate 200 MG capsule Take 250 mg by mouth 2 (two) times daily with a meal.     clotrimazole-betamethasone (LOTRISONE) cream Apply 1 application topically 2 (two) times daily. 30 g 0   co-enzyme Q-10 30 MG capsule      cycloSPORINE (RESTASIS) 0.05 % ophthalmic emulsion Instill 1 drop into both eyes twice a day 180 each 6   fexofenadine (FT ALLERGY RELIEF 24 HOUR) 180 MG tablet Take 1 tablet (180 mg total) by mouth daily. 90 tablet 3   fish oil-omega-3 fatty acids 1000 MG capsule Take 2 g by mouth daily.     Fluocin-Hydroquinone-Tretinoin (TRI-LUMA) 0.01-4-0.05 % CREA      Glucosamine HCl (GLUCOSAMINE PO) Take by mouth 2 (two) times daily.     glucose blood (FREESTYLE LITE) test strip Use as directed up to four times daily to test blood sugars. 100 each 0   hydrochlorothiazide (HYDRODIURIL) 25 MG tablet Take 1 tablet (25 mg total) by mouth daily. 90 tablet 1   Lancets (FREESTYLE) lancets Use as directed to test blood sugars up to four times daily. 100 each 0   metoprolol tartrate (LOPRESSOR) 50 MG tablet Take 1 tablet (50 mg total) by mouth daily. 90 tablet 1   Multiple Vitamin (MULTIVITAMIN) tablet Take 1 tablet by mouth daily.     nystatin (MYCOSTATIN/NYSTOP) powder Apply topically 4 (four) times daily. 60 g 5   nystatin cream (MYCOSTATIN) APPLY TO THE AFFECTED AREA(S) 2 TIMES PER DAY 30 g 3   omega-3 acid ethyl esters (LOVAZA) 1 g capsule Take by mouth.     RESTASIS 0.05 %  ophthalmic emulsion      rosuvastatin (CRESTOR) 10 MG tablet Take 1 tablet (10 mg total) by mouth daily. 90 tablet 1   tirzepatide (MOUNJARO) 5 MG/0.5ML Pen Inject 5 mg into the skin once a week. 2 mL 1   triamcinolone cream (KENALOG) 0.1 % Apply 1 Application topically 2 (two) times daily. 30 g 0   cycloSPORINE (RESTASIS) 0.05 % ophthalmic emulsion Place 1 drop into both eyes 2 (two) times daily. (Patient not taking: Reported on 01/20/2024)     glucose blood test strip      No current facility-administered medications on file prior to visit.    Review of Systems  Constitutional:  Negative for fever.  Eyes:  Negative for visual disturbance.  Respiratory:  Negative for cough, shortness of breath and wheezing.   Cardiovascular:  Negative for chest pain, palpitations and leg swelling.  Gastrointestinal:  Negative for abdominal pain, blood in stool, constipation and diarrhea.       Occ gerd  Genitourinary:  Negative for dysuria.  Musculoskeletal:  Positive for arthralgias (right shoulder) and back pain (activity related).  Skin:  Negative for rash.  Neurological:  Positive for numbness (b/l hands). Negative for light-headedness and headaches.  Psychiatric/Behavioral:  Negative for dysphoric mood. The patient is not  nervous/anxious.        Objective:   Vitals:   01/20/24 0908  BP: 118/82  Pulse: 66  Temp: 98 F (36.7 C)  SpO2: 98%   Filed Weights   01/20/24 0908  Weight: 158 lb 3.2 oz (71.8 kg)   Body mass index is 28.02 kg/m.  BP Readings from Last 3 Encounters:  01/20/24 118/82  07/18/23 116/70  01/17/23 120/80    Wt Readings from Last 3 Encounters:  01/20/24 158 lb 3.2 oz (71.8 kg)  07/18/23 159 lb (72.1 kg)  01/17/23 169 lb (76.7 kg)       Physical Exam Constitutional: She appears well-developed and well-nourished. No distress.  HENT:  Head: Normocephalic and atraumatic.  Right Ear: External ear normal. Normal ear canal and TM Left Ear: External ear normal.   Normal ear canal and TM Mouth/Throat: Oropharynx is clear and moist.  Eyes: Conjunctivae normal.  Neck: Neck supple. No tracheal deviation present. No thyromegaly present.  No carotid bruit  Cardiovascular: Normal rate, regular rhythm and normal heart sounds.   No murmur heard.  No edema. Pulmonary/Chest: Effort normal and breath sounds normal. No respiratory distress. She has no wheezes. She has no rales.  Breast: deferred   Abdominal: Soft. She exhibits no distension. There is no tenderness.  Lymphadenopathy: She has no cervical adenopathy.  Skin: Skin is warm and dry. She is not diaphoretic.  Psychiatric: She has a normal mood and affect. Her behavior is normal.     Lab Results  Component Value Date   WBC 7.8 07/12/2022   HGB 12.3 07/12/2022   HCT 36.8 07/12/2022   PLT 273.0 07/12/2022   GLUCOSE 92 07/18/2023   CHOL 171 07/18/2023   TRIG 102.0 07/18/2023   HDL 39.00 (L) 07/18/2023   LDLCALC 112 (H) 07/18/2023   ALT 19 07/18/2023   AST 19 07/18/2023   NA 137 07/18/2023   K 3.5 07/18/2023   CL 101 07/18/2023   CREATININE 0.75 07/18/2023   BUN 14 07/18/2023   CO2 30 07/18/2023   TSH 2.75 07/12/2022   HGBA1C 5.8 07/18/2023   MICROALBUR 1.7 01/17/2023         Assessment & Plan:   Physical exam: Screening blood work  ordered Exercise  walking on nice days Weight  overweight - losing weight with mounjaro Substance abuse  none   Reviewed recommended immunizations.   Health Maintenance  Topic Date Due   DEXA SCAN  12/17/2021   COVID-19 Vaccine (1 - 2024-25 season) Never done   MAMMOGRAM  10/13/2023   Diabetic kidney evaluation - Urine ACR  01/18/2024   HEMOGLOBIN A1C  01/18/2024   Diabetic kidney evaluation - eGFR measurement  07/17/2024   FOOT EXAM  07/17/2024   OPHTHALMOLOGY EXAM  11/12/2024   DTaP/Tdap/Td (2 - Td or Tdap) 05/05/2028   Colonoscopy  11/27/2028   Pneumonia Vaccine 13+ Years old  Completed   INFLUENZA VACCINE  Completed   Hepatitis C  Screening  Completed   Zoster Vaccines- Shingrix  Completed   HPV VACCINES  Aged Out     Last mammogram and DEXA requested from GYN     See Problem List for Assessment and Plan of chronic medical problems.

## 2024-01-20 ENCOUNTER — Other Ambulatory Visit: Payer: Self-pay

## 2024-01-20 ENCOUNTER — Ambulatory Visit: Payer: 59 | Admitting: Internal Medicine

## 2024-01-20 ENCOUNTER — Other Ambulatory Visit (HOSPITAL_COMMUNITY): Payer: Self-pay

## 2024-01-20 VITALS — BP 118/82 | HR 66 | Temp 98.0°F | Ht 63.0 in | Wt 158.2 lb

## 2024-01-20 DIAGNOSIS — E785 Hyperlipidemia, unspecified: Secondary | ICD-10-CM

## 2024-01-20 DIAGNOSIS — M8589 Other specified disorders of bone density and structure, multiple sites: Secondary | ICD-10-CM | POA: Diagnosis not present

## 2024-01-20 DIAGNOSIS — I10 Essential (primary) hypertension: Secondary | ICD-10-CM

## 2024-01-20 DIAGNOSIS — Z0000 Encounter for general adult medical examination without abnormal findings: Secondary | ICD-10-CM | POA: Diagnosis not present

## 2024-01-20 DIAGNOSIS — E559 Vitamin D deficiency, unspecified: Secondary | ICD-10-CM | POA: Diagnosis not present

## 2024-01-20 DIAGNOSIS — E119 Type 2 diabetes mellitus without complications: Secondary | ICD-10-CM | POA: Diagnosis not present

## 2024-01-20 DIAGNOSIS — Z7985 Long-term (current) use of injectable non-insulin antidiabetic drugs: Secondary | ICD-10-CM

## 2024-01-20 DIAGNOSIS — E782 Mixed hyperlipidemia: Secondary | ICD-10-CM | POA: Diagnosis not present

## 2024-01-20 LAB — LIPID PANEL
Cholesterol: 190 mg/dL (ref 0–200)
HDL: 50.5 mg/dL (ref >39.00)
LDL Cholesterol: 118 mg/dL — ABNORMAL HIGH (ref 0–99)
NonHDL: 139.8
Total CHOL/HDL Ratio: 4
Triglycerides: 108 mg/dL (ref 0.0–149.0)
VLDL: 21.6 mg/dL (ref 0.0–40.0)

## 2024-01-20 LAB — COMPREHENSIVE METABOLIC PANEL
ALT: 16 U/L (ref 0–35)
AST: 21 U/L (ref 0–37)
Albumin: 4.4 g/dL (ref 3.5–5.2)
Alkaline Phosphatase: 67 U/L (ref 39–117)
BUN: 15 mg/dL (ref 6–23)
CO2: 32 meq/L (ref 19–32)
Calcium: 9.9 mg/dL (ref 8.4–10.5)
Chloride: 100 meq/L (ref 96–112)
Creatinine, Ser: 0.75 mg/dL (ref 0.40–1.20)
GFR: 81.01 mL/min (ref >60.00)
Glucose, Bld: 103 mg/dL — ABNORMAL HIGH (ref 70–99)
Potassium: 3.5 meq/L (ref 3.5–5.1)
Sodium: 139 meq/L (ref 135–145)
Total Bilirubin: 0.6 mg/dL (ref 0.2–1.2)
Total Protein: 8 g/dL (ref 6.0–8.3)

## 2024-01-20 LAB — CBC WITH DIFFERENTIAL/PLATELET
Basophils Absolute: 0.1 10*3/uL (ref 0.0–0.1)
Basophils Relative: 0.7 % (ref 0.0–3.0)
Eosinophils Absolute: 0.2 10*3/uL (ref 0.0–0.7)
Eosinophils Relative: 2.1 % (ref 0.0–5.0)
HCT: 40 % (ref 36.0–46.0)
Hemoglobin: 13.1 g/dL (ref 12.0–15.0)
Lymphocytes Relative: 33 % (ref 12.0–46.0)
Lymphs Abs: 2.6 10*3/uL (ref 0.7–4.0)
MCHC: 32.7 g/dL (ref 30.0–36.0)
MCV: 92.6 fL (ref 78.0–100.0)
Monocytes Absolute: 0.7 10*3/uL (ref 0.1–1.0)
Monocytes Relative: 8.8 % (ref 3.0–12.0)
Neutro Abs: 4.4 10*3/uL (ref 1.4–7.7)
Neutrophils Relative %: 55.4 % (ref 43.0–77.0)
Platelets: 334 10*3/uL (ref 150.0–400.0)
RBC: 4.32 Mil/uL (ref 3.87–5.11)
RDW: 13.6 % (ref 11.5–15.5)
WBC: 7.9 10*3/uL (ref 4.0–10.5)

## 2024-01-20 LAB — HEMOGLOBIN A1C: Hgb A1c MFr Bld: 6 % (ref 4.6–6.5)

## 2024-01-20 LAB — TSH: TSH: 2.82 u[IU]/mL (ref 0.35–5.50)

## 2024-01-20 LAB — VITAMIN D 25 HYDROXY (VIT D DEFICIENCY, FRACTURES): VITD: 33.06 ng/mL (ref 30.00–100.00)

## 2024-01-20 MED ORDER — METOPROLOL TARTRATE 50 MG PO TABS
50.0000 mg | ORAL_TABLET | Freq: Every day | ORAL | 1 refills | Status: DC
  Filled 2024-01-20: qty 90, 90d supply, fill #0
  Filled 2024-04-22: qty 90, 90d supply, fill #1

## 2024-01-20 MED ORDER — ROSUVASTATIN CALCIUM 10 MG PO TABS
10.0000 mg | ORAL_TABLET | Freq: Every day | ORAL | 1 refills | Status: DC
  Filled 2024-01-20: qty 90, 90d supply, fill #0
  Filled 2024-06-25: qty 90, 90d supply, fill #1

## 2024-01-20 MED ORDER — FREESTYLE LITE TEST VI STRP
ORAL_STRIP | 3 refills | Status: AC
Start: 2024-01-20 — End: ?
  Filled 2024-01-20: qty 200, 50d supply, fill #0

## 2024-01-20 MED ORDER — TIRZEPATIDE 2.5 MG/0.5ML ~~LOC~~ SOAJ
2.5000 mg | SUBCUTANEOUS | 5 refills | Status: DC
Start: 2024-01-20 — End: 2024-07-19
  Filled 2024-01-20 – 2024-02-17 (×3): qty 2, 28d supply, fill #0
  Filled 2024-03-30: qty 2, 28d supply, fill #1
  Filled 2024-04-22: qty 2, 28d supply, fill #2
  Filled 2024-06-03: qty 2, 28d supply, fill #3
  Filled 2024-06-25: qty 2, 28d supply, fill #4

## 2024-01-20 NOTE — Assessment & Plan Note (Signed)
Chronic Blood pressure well controlled CMP, CBC Continue HCTZ 25 mg daily, metoprolol 50 mg daily

## 2024-01-20 NOTE — Assessment & Plan Note (Signed)
Chronic ?Regular exercise and healthy diet encouraged ?Check lipid panel, CMP, TSH ?Continue Crestor 10 mg daily ?

## 2024-01-20 NOTE — Assessment & Plan Note (Signed)
Chronic Taking vitamin D daily Check vitamin D level

## 2024-01-20 NOTE — Assessment & Plan Note (Signed)
Chronic DEXA done with gynecology Continue calcium and vitamin D supplementation Check vitamin D level Stressed regular exercise

## 2024-01-20 NOTE — Assessment & Plan Note (Signed)
Chronic   Lab Results  Component Value Date   HGBA1C 5.8 07/18/2023   Sugars not ideally controlled Check A1c, urine microalbumin Continue Mounjaro 5 mg weekly Stressed regular exercise, diabetic diet

## 2024-01-21 ENCOUNTER — Other Ambulatory Visit: Payer: Self-pay

## 2024-01-22 ENCOUNTER — Encounter: Payer: Self-pay | Admitting: Internal Medicine

## 2024-01-22 ENCOUNTER — Encounter: Payer: 59 | Admitting: Internal Medicine

## 2024-01-22 LAB — MICROALBUMIN / CREATININE URINE RATIO
Creatinine,U: 164.6 mg/dL
Microalb Creat Ratio: 5.6 mg/g (ref 0.0–30.0)
Microalb, Ur: 0.9 mg/dL (ref 0.0–1.9)

## 2024-01-28 ENCOUNTER — Other Ambulatory Visit (HOSPITAL_COMMUNITY): Payer: Self-pay

## 2024-01-28 NOTE — Telephone Encounter (Signed)
 Pharmacy Patient Advocate Encounter   Received notification from CoverMyMeds that prior authorization for Mounjaro2.5mg /0.26ml is required/requested.   Insurance verification completed.   The patient is insured through [facility] .   Per test claim: PA required; PA submitted to above mentioned insurance via Phone Key/confirmation #/[id number] Status is pending   Phone# [phone]

## 2024-01-29 ENCOUNTER — Other Ambulatory Visit (HOSPITAL_COMMUNITY): Payer: Self-pay

## 2024-01-30 ENCOUNTER — Other Ambulatory Visit (HOSPITAL_COMMUNITY): Payer: Self-pay

## 2024-02-02 NOTE — Telephone Encounter (Signed)
 Pharmacy Patient Advocate Encounter  Received notification from [facility] that Prior Authorization for [facility] 2.5mg /0.29ml has been CANCELLED due to this medication does not require a prior authorization because it is a covered benefit.   PA #/Case ID/Reference #: [id number]

## 2024-02-11 ENCOUNTER — Other Ambulatory Visit: Payer: Self-pay | Admitting: Internal Medicine

## 2024-02-11 ENCOUNTER — Other Ambulatory Visit (HOSPITAL_COMMUNITY): Payer: Self-pay

## 2024-02-11 DIAGNOSIS — I10 Essential (primary) hypertension: Secondary | ICD-10-CM

## 2024-02-12 ENCOUNTER — Other Ambulatory Visit (HOSPITAL_COMMUNITY): Payer: Self-pay

## 2024-02-12 ENCOUNTER — Other Ambulatory Visit: Payer: Self-pay

## 2024-02-12 MED ORDER — HYDROCHLOROTHIAZIDE 25 MG PO TABS
25.0000 mg | ORAL_TABLET | Freq: Every day | ORAL | 1 refills | Status: DC
Start: 2024-02-12 — End: 2024-09-02
  Filled 2024-02-12: qty 90, 90d supply, fill #0
  Filled 2024-04-22 – 2024-04-26 (×2): qty 90, 90d supply, fill #1

## 2024-02-17 ENCOUNTER — Other Ambulatory Visit (HOSPITAL_COMMUNITY): Payer: Self-pay

## 2024-02-18 ENCOUNTER — Other Ambulatory Visit (HOSPITAL_COMMUNITY): Payer: Self-pay

## 2024-03-30 ENCOUNTER — Other Ambulatory Visit: Payer: Self-pay

## 2024-03-30 ENCOUNTER — Other Ambulatory Visit (HOSPITAL_COMMUNITY): Payer: Self-pay

## 2024-04-22 ENCOUNTER — Other Ambulatory Visit: Payer: Self-pay | Admitting: Internal Medicine

## 2024-04-23 ENCOUNTER — Other Ambulatory Visit: Payer: Self-pay

## 2024-04-23 ENCOUNTER — Other Ambulatory Visit (HOSPITAL_COMMUNITY): Payer: Self-pay

## 2024-04-23 MED ORDER — NYSTATIN 100000 UNIT/GM EX CREA
TOPICAL_CREAM | CUTANEOUS | 3 refills | Status: AC
  Filled 2024-04-23: qty 30, 15d supply, fill #0

## 2024-04-23 MED ORDER — NYSTATIN 100000 UNIT/GM EX POWD
Freq: Four times a day (QID) | CUTANEOUS | 5 refills | Status: AC
  Filled 2024-04-23: qty 60, 15d supply, fill #0

## 2024-06-03 ENCOUNTER — Other Ambulatory Visit (HOSPITAL_COMMUNITY): Payer: Self-pay

## 2024-06-26 ENCOUNTER — Other Ambulatory Visit (HOSPITAL_COMMUNITY): Payer: Self-pay

## 2024-06-28 ENCOUNTER — Other Ambulatory Visit: Payer: Self-pay

## 2024-07-18 ENCOUNTER — Encounter: Payer: Self-pay | Admitting: Internal Medicine

## 2024-07-18 NOTE — Progress Notes (Signed)
 Subjective:    Patient ID: Yesenia Larson, female    DOB: [birth year], 68 y.o.   MRN: [id number]     HPI Yesenia Larson is here for follow up of her chronic medical problems.   Not exercising enough.    Right heel hurts sometimes when she gets up in the morning.   Medications and allergies reviewed with patient and updated if appropriate.  Current Outpatient Medications on File Prior to Visit  Medication Sig Dispense Refill   blood glucose meter kit and supplies Use up to four times daily as directed.  1 each 0   calcium  carbonate 200 MG capsule Take 250 mg by mouth 2 (two) times daily with a meal.     clotrimazole -betamethasone  (LOTRISONE ) cream Apply 1 application topically 2 (two) times daily. 30 g 0   co-enzyme Q-10 30 MG capsule      cycloSPORINE  (RESTASIS ) 0.05 % ophthalmic emulsion Instill 1 drop into both eyes twice a day 180 each 6   fexofenadine  (FT ALLERGY RELIEF 24 HOUR) 180 MG tablet Take 1 tablet (180 mg total) by mouth daily. 90 tablet 3   fish oil-omega-3 fatty acids 1000 MG capsule Take 2 g by mouth daily.     Fluocin-Hydroquinone-Tretinoin (TRI-LUMA) 0.01-4-0.05 % CREA      Glucosamine HCl (GLUCOSAMINE PO) Take by mouth 2 (two) times daily.     glucose blood (FREESTYLE LITE) test strip Use as directed up to four times daily to test blood sugars. 200 each 3   glucose blood test strip      hydrochlorothiazide  (HYDRODIURIL ) 25 MG tablet Take 1 tablet (25 mg total) by mouth daily. 90 tablet 1   Lancets (FREESTYLE) lancets Use as directed to test blood sugars up to four times daily. 100 each 0   metoprolol  tartrate (LOPRESSOR ) 50 MG tablet Take 1 tablet (50 mg total) by mouth daily. 90 tablet 1   Multiple Vitamin (MULTIVITAMIN) tablet Take 1 tablet by mouth daily.     nystatin  (NYAMYC ) powder Apply topically 4 (four) times daily. 60 g 5   nystatin  cream (MYCOSTATIN ) APPLY TO THE AFFECTED AREA(S) 2 TIMES PER DAY 30 g 3   omega-3 acid ethyl esters (LOVAZA) 1 g capsule  Take by mouth.     rosuvastatin  (CRESTOR ) 10 MG tablet Take 1 tablet (10 mg total) by mouth daily. 90 tablet 1   triamcinolone  cream (KENALOG ) 0.1 % Apply 1 Application topically 2 (two) times daily. 30 g 0   No current facility-administered medications on file prior to visit.     Review of Systems  Constitutional:  Negative for fever.  Respiratory:  Negative for cough, shortness of breath and wheezing.   Cardiovascular:  Negative for chest pain, palpitations and leg swelling.  Neurological:  Negative for light-headedness and headaches.       Objective:   Vitals:   07/19/24 0951  BP: 116/70  Pulse: 61  Temp: 98 F (36.7 C)  SpO2: 96%   BP Readings from Last 3 Encounters:  07/19/24 116/70  01/20/24 118/82  07/18/23 116/70   Wt Readings from Last 3 Encounters:  07/19/24 161 lb (73 kg)  01/20/24 158 lb 3.2 oz (71.8 kg)  07/18/23 159 lb (72.1 kg)   Body mass index is 28.52 kg/m.    Physical Exam Constitutional:      General: She is not in acute distress.    Appearance: Normal appearance.  HENT:     Head: Normocephalic and atraumatic.  Eyes:  Conjunctiva/sclera: Conjunctivae normal.  Cardiovascular:     Rate and Rhythm: Normal rate and regular rhythm.     Heart sounds: Murmur (2/6 sys) heard.  Pulmonary:     Effort: Pulmonary effort is normal. No respiratory distress.     Breath sounds: Normal breath sounds. No wheezing.  Musculoskeletal:     Cervical back: Neck supple.     Right lower leg: No edema.     Left lower leg: No edema.  Lymphadenopathy:     Cervical: No cervical adenopathy.  Skin:    General: Skin is warm and dry.     Findings: No rash.  Neurological:     Mental Status: She is alert. Mental status is at baseline.  Psychiatric:        Mood and Affect: Mood normal.        Behavior: Behavior normal.       Diabetic Foot Exam - Simple   Simple Foot Form Diabetic Foot exam was performed with the following findings: Yes 07/19/2024 10:35 AM   Visual Inspection No deformities, no ulcerations, no other skin breakdown bilaterally: Yes Sensation Testing Intact to touch and monofilament testing bilaterally: Yes Pulse Check Posterior Tibialis and Dorsalis pulse intact bilaterally: Yes Comments Mild pain with palpation of plantar fascia tendon insertion      Lab Results  Component Value Date   WBC 7.9 01/20/2024   HGB 13.1 01/20/2024   HCT 40.0 01/20/2024   PLT 334.0 01/20/2024   GLUCOSE 103 (H) 01/20/2024   CHOL 190 01/20/2024   TRIG 108.0 01/20/2024   HDL 50.50 01/20/2024   LDLCALC 118 (H) 01/20/2024   ALT 16 01/20/2024   AST 21 01/20/2024   NA 139 01/20/2024   K 3.5 01/20/2024   CL 100 01/20/2024   CREATININE 0.75 01/20/2024   BUN 15 01/20/2024   CO2 32 01/20/2024   TSH 2.82 01/20/2024   HGBA1C 6.0 01/20/2024   MICROALBUR 0.9 01/20/2024     Assessment & Plan:    See Problem List for Assessment and Plan of chronic medical problems.

## 2024-07-18 NOTE — Patient Instructions (Addendum)
      Blood work was ordered.       Medications changes include :   increase mounjaro  to 5 mg weekly      Return in about 6 months (around 01/19/2025) for Physical Exam.

## 2024-07-19 ENCOUNTER — Ambulatory Visit: Payer: Self-pay | Admitting: Internal Medicine

## 2024-07-19 ENCOUNTER — Ambulatory Visit: Payer: 59 | Admitting: Internal Medicine

## 2024-07-19 ENCOUNTER — Other Ambulatory Visit (HOSPITAL_COMMUNITY): Payer: Self-pay

## 2024-07-19 ENCOUNTER — Other Ambulatory Visit: Payer: Self-pay | Admitting: Internal Medicine

## 2024-07-19 VITALS — BP 116/70 | HR 61 | Temp 98.0°F | Ht 63.0 in | Wt 161.0 lb

## 2024-07-19 DIAGNOSIS — M722 Plantar fascial fibromatosis: Secondary | ICD-10-CM | POA: Insufficient documentation

## 2024-07-19 DIAGNOSIS — M8589 Other specified disorders of bone density and structure, multiple sites: Secondary | ICD-10-CM

## 2024-07-19 DIAGNOSIS — E119 Type 2 diabetes mellitus without complications: Secondary | ICD-10-CM | POA: Diagnosis not present

## 2024-07-19 DIAGNOSIS — I10 Essential (primary) hypertension: Secondary | ICD-10-CM

## 2024-07-19 DIAGNOSIS — R011 Cardiac murmur, unspecified: Secondary | ICD-10-CM | POA: Insufficient documentation

## 2024-07-19 DIAGNOSIS — E559 Vitamin D deficiency, unspecified: Secondary | ICD-10-CM

## 2024-07-19 DIAGNOSIS — E782 Mixed hyperlipidemia: Secondary | ICD-10-CM | POA: Diagnosis not present

## 2024-07-19 LAB — COMPREHENSIVE METABOLIC PANEL WITH GFR
ALT: 18 U/L (ref 0–35)
AST: 21 U/L (ref 0–37)
Albumin: 4.2 g/dL (ref 3.5–5.2)
Alkaline Phosphatase: 64 U/L (ref 39–117)
BUN: 12 mg/dL (ref 6–23)
CO2: 31 meq/L (ref 19–32)
Calcium: 9.6 mg/dL (ref 8.4–10.5)
Chloride: 100 meq/L (ref 96–112)
Creatinine, Ser: 0.63 mg/dL (ref 0.40–1.20)
GFR: 89.95 mL/min (ref >60.00)
Glucose, Bld: 99 mg/dL (ref 70–99)
Potassium: 4 meq/L (ref 3.5–5.1)
Sodium: 137 meq/L (ref 135–145)
Total Bilirubin: 0.5 mg/dL (ref 0.2–1.2)
Total Protein: 7.5 g/dL (ref 6.0–8.3)

## 2024-07-19 LAB — LIPID PANEL
Cholesterol: 154 mg/dL (ref 0–200)
HDL: 47.9 mg/dL (ref >39.00)
LDL Cholesterol: 82 mg/dL (ref 0–99)
NonHDL: 106.51
Total CHOL/HDL Ratio: 3
Triglycerides: 124 mg/dL (ref 0.0–149.0)
VLDL: 24.8 mg/dL (ref 0.0–40.0)

## 2024-07-19 LAB — HEMOGLOBIN A1C: Hgb A1c MFr Bld: 6.2 % (ref 4.6–6.5)

## 2024-07-19 MED ORDER — METOPROLOL TARTRATE 50 MG PO TABS
50.0000 mg | ORAL_TABLET | Freq: Every day | ORAL | 1 refills | Status: DC
  Filled 2024-07-19: qty 90, 90d supply, fill #0
  Filled 2024-09-02 – 2024-11-02 (×2): qty 90, 90d supply, fill #1

## 2024-07-19 MED ORDER — TIRZEPATIDE 5 MG/0.5ML ~~LOC~~ SOAJ
5.0000 mg | SUBCUTANEOUS | 5 refills | Status: AC
  Filled 2024-07-19: qty 2, 28d supply, fill #0
  Filled 2024-09-02: qty 2, 28d supply, fill #1
  Filled 2024-10-07 (×2): qty 2, 28d supply, fill #2
  Filled 2024-11-02: qty 2, 28d supply, fill #3
  Filled 2024-12-06: qty 2, 28d supply, fill #4
  Filled 2024-12-31: qty 2, 28d supply, fill #5

## 2024-07-19 NOTE — Assessment & Plan Note (Signed)
 Chronic Regular exercise and healthy diet encouraged Check lipid panel, CMP Continue Crestor 10 mg daily

## 2024-07-19 NOTE — Assessment & Plan Note (Signed)
Chronic Taking vitamin D daily 

## 2024-07-19 NOTE — Assessment & Plan Note (Signed)
 Chronic Blood pressure well controlled CMP Continue HCTZ 25 mg daily, metoprolol  50 mg daily

## 2024-07-19 NOTE — Assessment & Plan Note (Signed)
 Heart murmur on exam Asymptomatic Will get echocardiogram

## 2024-07-19 NOTE — Assessment & Plan Note (Signed)
 New Right heel pain when she first gets up after sitting or sleeping Improves as she walks Tenderness insertion site of plantar fascia Advised stretching which she has been doing a little bit, but recommended doing it religiously Avoid going barefoot-discussed good shoes

## 2024-07-19 NOTE — Assessment & Plan Note (Addendum)
 Chronic   Lab Results  Component Value Date   HGBA1C 6.0 01/20/2024   Sugars controlled Check A1c Continue Mounjaro   - increase dose 5 mg weekly Stressed regular exercise, diabetic diet

## 2024-07-19 NOTE — Assessment & Plan Note (Signed)
 Chronic DEXA done with gynecology Continue calcium and vitamin D supplementation Check vitamin D level Stressed regular exercise

## 2024-07-30 ENCOUNTER — Ambulatory Visit (HOSPITAL_COMMUNITY)
Admission: RE | Admit: 2024-07-30 | Discharge: 2024-07-30 | Disposition: A | Discharge status: Home / Self Care | Source: Ambulatory Visit | Attending: Vascular Surgery | Admitting: Vascular Surgery

## 2024-07-30 DIAGNOSIS — R011 Cardiac murmur, unspecified: Secondary | ICD-10-CM | POA: Diagnosis not present

## 2024-07-30 LAB — ECHOCARDIOGRAM COMPLETE
AR max vel: 0.91 cm2
AV Area VTI: 0.95 cm2
AV Area mean vel: 0.95 cm2
AV Mean grad: 5 mmHg
AV Peak grad: 10.2 mmHg
Ao pk vel: 1.6 m/s
Area-P 1/2: 3.27 cm2
S' Lateral: 2.35 cm

## 2024-09-02 ENCOUNTER — Other Ambulatory Visit: Payer: Self-pay | Admitting: Internal Medicine

## 2024-09-02 DIAGNOSIS — I10 Essential (primary) hypertension: Secondary | ICD-10-CM

## 2024-09-03 ENCOUNTER — Other Ambulatory Visit: Payer: Self-pay

## 2024-09-03 ENCOUNTER — Other Ambulatory Visit (HOSPITAL_COMMUNITY): Payer: Self-pay

## 2024-09-03 MED ORDER — HYDROCHLOROTHIAZIDE 25 MG PO TABS
25.0000 mg | ORAL_TABLET | Freq: Every day | ORAL | 1 refills | Status: AC
  Filled 2024-09-03: qty 90, 90d supply, fill #0
  Filled 2024-11-02 – 2024-12-06 (×2): qty 90, 90d supply, fill #1

## 2024-09-08 ENCOUNTER — Other Ambulatory Visit: Payer: Self-pay

## 2024-09-08 ENCOUNTER — Other Ambulatory Visit (HOSPITAL_COMMUNITY): Payer: Self-pay

## 2024-10-07 ENCOUNTER — Other Ambulatory Visit: Payer: Self-pay | Admitting: Internal Medicine

## 2024-10-07 ENCOUNTER — Other Ambulatory Visit (HOSPITAL_COMMUNITY): Payer: Self-pay

## 2024-10-07 DIAGNOSIS — E785 Hyperlipidemia, unspecified: Secondary | ICD-10-CM

## 2024-10-07 MED ORDER — ROSUVASTATIN CALCIUM 10 MG PO TABS
10.0000 mg | ORAL_TABLET | Freq: Every day | ORAL | 1 refills | Status: AC
  Filled 2024-10-07: qty 90, 90d supply, fill #0

## 2024-11-02 ENCOUNTER — Other Ambulatory Visit (HOSPITAL_COMMUNITY): Payer: Self-pay

## 2024-11-08 DIAGNOSIS — Z1231 Encounter for screening mammogram for malignant neoplasm of breast: Secondary | ICD-10-CM | POA: Diagnosis not present

## 2024-11-15 DIAGNOSIS — E119 Type 2 diabetes mellitus without complications: Secondary | ICD-10-CM | POA: Diagnosis not present

## 2024-11-16 LAB — OPHTHALMOLOGY REPORT-SCANNED

## 2024-12-06 ENCOUNTER — Other Ambulatory Visit: Payer: Self-pay

## 2024-12-06 ENCOUNTER — Other Ambulatory Visit: Payer: Self-pay | Admitting: Internal Medicine

## 2024-12-06 ENCOUNTER — Other Ambulatory Visit (HOSPITAL_COMMUNITY): Payer: Self-pay

## 2024-12-06 DIAGNOSIS — I10 Essential (primary) hypertension: Secondary | ICD-10-CM

## 2024-12-06 MED ORDER — METOPROLOL TARTRATE 50 MG PO TABS
50.0000 mg | ORAL_TABLET | Freq: Every day | ORAL | 1 refills | Status: AC
  Filled 2024-12-06: qty 90, 90d supply, fill #0

## 2024-12-07 ENCOUNTER — Other Ambulatory Visit (HOSPITAL_COMMUNITY): Payer: Self-pay

## 2024-12-31 ENCOUNTER — Other Ambulatory Visit: Payer: Self-pay

## 2024-12-31 ENCOUNTER — Other Ambulatory Visit (HOSPITAL_COMMUNITY): Payer: Self-pay

## 2025-01-27 ENCOUNTER — Encounter: Admitting: Internal Medicine
